# Patient Record
Sex: Female | Born: 1990
Health system: Southern US, Community
[De-identification: ages and names within clinical notes are randomized; demographics above are authoritative.]

## PROBLEM LIST (undated history)

## (undated) DIAGNOSIS — T7840XA Allergy, unspecified, initial encounter: Secondary | ICD-10-CM

## (undated) DIAGNOSIS — F32A Depression, unspecified: Secondary | ICD-10-CM

## (undated) DIAGNOSIS — F419 Anxiety disorder, unspecified: Secondary | ICD-10-CM

## (undated) DIAGNOSIS — D649 Anemia, unspecified: Secondary | ICD-10-CM

## (undated) DIAGNOSIS — F329 Major depressive disorder, single episode, unspecified: Secondary | ICD-10-CM

## (undated) DIAGNOSIS — R011 Cardiac murmur, unspecified: Secondary | ICD-10-CM

## (undated) DIAGNOSIS — B009 Herpesviral infection, unspecified: Secondary | ICD-10-CM

## (undated) HISTORY — DX: Anemia, unspecified: D64.9

## (undated) HISTORY — DX: Anxiety disorder, unspecified: F41.9

## (undated) HISTORY — PX: NO PAST SURGERIES: SHX2092

## (undated) HISTORY — DX: Herpesviral infection, unspecified: B00.9

## (undated) HISTORY — DX: Allergy, unspecified, initial encounter: T78.40XA

## (undated) HISTORY — DX: Cardiac murmur, unspecified: R01.1

## (undated) HISTORY — DX: Depression, unspecified: F32.A

## (undated) HISTORY — DX: Major depressive disorder, single episode, unspecified: F32.9

---

## 2012-05-30 ENCOUNTER — Ambulatory Visit: Payer: Self-pay | Admitting: Family Medicine

## 2012-05-30 VITALS — BP 100/60 | HR 60 | Temp 98.2°F | Resp 16 | Ht 66.0 in | Wt 134.0 lb

## 2012-05-30 DIAGNOSIS — B009 Herpesviral infection, unspecified: Secondary | ICD-10-CM

## 2012-05-30 DIAGNOSIS — E329 Disease of thymus, unspecified: Secondary | ICD-10-CM

## 2012-05-30 MED ORDER — ACYCLOVIR 200 MG PO CAPS: 400.0000 mg | ORAL_CAPSULE | Freq: Two times a day (BID) | ORAL | Status: DC

## 2012-05-30 NOTE — Progress Notes (Signed)
  Subjective:    Patient ID: Lauren Jensen, female    DOB: [birth year], 21 y.o.   MRN: [id number]  HPI  Started on daily suppression acyclovir after her first outbreak 1 yr prev as the outbreak was intensely painfuol.  Thinks she has had 2-3 very very mild outbreaks in the past year but unsure as not painful - just bumps.  Denies ever having ulcerations. States that she had nodules with her first outbreak and now frequently has these nodules - confident they are from the HSV and not glands or follicles as never had them prior. Do not bother her, not painful. Pt is sexually active in a monogamous relationship. Reports her partner also has HSV. Does not desire pregnancy, is only using condoms for birth control as doesn't want to take more pills but worried about Depo as sister had an allergic reaction to it.      Review of Systems  Constitutional: Negative for fever and chills.  Gastrointestinal: Negative for abdominal pain, diarrhea and constipation.  Genitourinary: Negative for dysuria, vaginal discharge, genital sores, vaginal pain, menstrual problem and pelvic pain.  Skin: Negative for rash and wound.       Objective:   Physical Exam  Constitutional: She is oriented to person, place, and time. She appears well-developed and well-nourished. No distress.  HENT:  Head: Normocephalic and atraumatic.  Right Ear: External ear normal.  Left Ear: External ear normal.  Eyes: Conjunctivae normal are normal. No scleral icterus.  Neck: Normal range of motion. Neck supple. No thyromegaly present.  Cardiovascular: Normal rate, regular rhythm and normal heart sounds.   Pulmonary/Chest: Effort normal and breath sounds normal. No respiratory distress.  Lymphadenopathy:    She has no cervical adenopathy.  Neurological: She is alert and oriented to person, place, and time.  Skin: Skin is warm and dry. She is not diaphoretic.  Psychiatric: She has a normal mood and affect. Her behavior is normal.           Assessment & Plan:  1. HSV - Discussed suppression vs recurrence acyclovir use and dosing.  Pt prefers to continue daily suppression dosing for now which is fine.  Reviewed pathology and transmission w/ pt. 2. Family Planning - Pt w/o health insurance - reviewed less expensive options for birth control. Pt thinks she would like to try Depo-Provera and will call the [facility] for an appt to get started.

## 2012-05-30 NOTE — Patient Instructions (Signed)
If you are having <6 outbreaks a year, you can stop taking the acyclovir daily and just use it for treatment. If you decide to do this, start taking 400mg  (2 tabs) of acyclovir three times a day for 5 days at the first sign that the outbreak is starting. The sooner you start it the more effective it will be.

## 2014-10-15 ENCOUNTER — Ambulatory Visit (INDEPENDENT_AMBULATORY_CARE_PROVIDER_SITE_OTHER): Payer: Self-pay | Admitting: Family Medicine

## 2014-10-15 VITALS — BP 116/62 | HR 74 | Temp 98.1°F | Resp 18 | Ht 66.25 in | Wt 164.4 lb

## 2014-10-15 DIAGNOSIS — J069 Acute upper respiratory infection, unspecified: Secondary | ICD-10-CM

## 2014-10-15 DIAGNOSIS — A609 Anogenital herpesviral infection, unspecified: Secondary | ICD-10-CM

## 2014-10-15 DIAGNOSIS — Z30011 Encounter for initial prescription of contraceptive pills: Secondary | ICD-10-CM

## 2014-10-15 MED ORDER — ACYCLOVIR 200 MG PO CAPS: ORAL_CAPSULE | ORAL | Status: DC

## 2014-10-15 MED ORDER — NORGESTIMATE-ETH ESTRADIOL 0.25-35 MG-MCG PO TABS: 1.0000 | ORAL_TABLET | Freq: Every day | ORAL | Status: DC

## 2014-10-15 NOTE — Progress Notes (Signed)
Subjective: Patient is here for 2 things. She wants birth control pills. She has not been on them before. She has had a regular sexual partner for the last 3/2 years, using condoms. She's had one pregnancy and abortion. She has not had any STDs except for herpes which she has had recurrences of over the years..  Her second problem is that she the herpes as noted above and wants medication. She uses acyclovir for that. She does not have insurance, and desires the least expensive possible.  Has had a respiratory tract infection which is been slowly resolving.  Objective: No acute distress. Chest clear. Heart regular without murmurs.  Assessment: Oral contraception HSV, history of recurrences Upper respiratory infections, slow resolving  Plan: Sprintec. Talked to her about 3 months cycling. Acyclovir 800 mg twice a day for 5 days when she has flares. The cough should continue to gradually resolve.

## 2014-10-15 NOTE — Patient Instructions (Signed)
Take birth control pills daily at same time.  After the 21 medicated pills move on to taking the next pack.  Do this for 2 packs, then on the third pack take through the placebo pills before starting on the next pills  Take valtrex 800 mg twice daily for herpes for 5 days each outbreak  Return if problems  Use condoms also to prevent disease transmission

## 2016-03-16 ENCOUNTER — Ambulatory Visit (INDEPENDENT_AMBULATORY_CARE_PROVIDER_SITE_OTHER): Payer: PRIVATE HEALTH INSURANCE | Admitting: Urgent Care

## 2016-03-16 VITALS — BP 122/76 | HR 85 | Temp 98.4°F | Resp 18 | Ht 66.25 in | Wt 166.0 lb

## 2016-03-16 DIAGNOSIS — L299 Pruritus, unspecified: Secondary | ICD-10-CM | POA: Diagnosis not present

## 2016-03-16 DIAGNOSIS — B009 Herpesviral infection, unspecified: Secondary | ICD-10-CM | POA: Diagnosis not present

## 2016-03-16 DIAGNOSIS — R21 Rash and other nonspecific skin eruption: Secondary | ICD-10-CM

## 2016-03-16 LAB — POCT SKIN KOH: SKIN KOH, POC: POSITIVE

## 2016-03-16 MED ORDER — TRIAMCINOLONE ACETONIDE 0.1 % EX CREA: 1.0000 "application " | TOPICAL_CREAM | Freq: Two times a day (BID) | CUTANEOUS | Status: DC

## 2016-03-16 MED ORDER — RANITIDINE HCL 150 MG PO TABS: 150.0000 mg | ORAL_TABLET | Freq: Two times a day (BID) | ORAL | Status: DC

## 2016-03-16 MED ORDER — FLUCONAZOLE 150 MG PO TABS: 150.0000 mg | ORAL_TABLET | ORAL | Status: DC

## 2016-03-16 MED ORDER — CETIRIZINE HCL 10 MG PO TABS: 10.0000 mg | ORAL_TABLET | Freq: Every day | ORAL | Status: DC

## 2016-03-16 MED ORDER — ACYCLOVIR 200 MG PO CAPS: ORAL_CAPSULE | ORAL | Status: DC

## 2016-03-16 NOTE — Patient Instructions (Addendum)
Rash A rash is a change in the color or texture of the skin. There are many different types of rashes. You may have other problems that accompany your rash. CAUSES   Infections.  Allergic reactions. This can include allergies to pets or foods.  Certain medicines.  Exposure to certain chemicals, soaps, or cosmetics.  Heat.  Exposure to poisonous plants.  Tumors, both cancerous and noncancerous. SYMPTOMS   Redness.  Scaly skin.  Itchy skin.  Dry or cracked skin.  Bumps.  Blisters.  Pain. DIAGNOSIS  Your caregiver may do a physical exam to determine what type of rash you have. A skin sample (biopsy) may be taken and examined under a microscope. TREATMENT  Treatment depends on the type of rash you have. Your caregiver may prescribe certain medicines. For serious conditions, you may need to see a skin doctor (dermatologist). HOME CARE INSTRUCTIONS   Avoid the substance that caused your rash.  Do not scratch your rash. This can cause infection.  You may take cool baths to help stop itching.  Only take over-the-counter or prescription medicines as directed by your caregiver.  Keep all follow-up appointments as directed by your caregiver. SEEK IMMEDIATE MEDICAL CARE IF:  You have increasing pain, swelling, or redness.  You have a fever.  You have new or severe symptoms.  You have body aches, diarrhea, or vomiting.  Your rash is not better after 3 days. MAKE SURE YOU:  Understand these instructions.  Will watch your condition.  Will get help right away if you are not doing well or get worse.   This information is not intended to replace advice given to you by your health care provider. Make sure you discuss any questions you have with your health care provider.   Document Released: 08/14/2002 Document Revised: 09/14/2014 Document Reviewed: 01/09/2015 Elsevier Interactive Patient Education 2016 Elsevier Inc.     IF you received an x-ray today, you will  receive an invoice from [facility]. Please contact [facility] at [phone] with questions or concerns regarding your invoice.   IF you received labwork today, you will receive an invoice from [facility]. Please contact [facility] at [phone] with questions or concerns regarding your invoice.   Our billing staff will not be able to assist you with questions regarding bills from these companies.  You will be contacted with the lab results as soon as they are available. The fastest way to get your results is to activate your My Chart account. Instructions are located on the last page of this paperwork. If you have not heard from us regarding the results in 2 weeks, please contact this office.      

## 2016-03-16 NOTE — Progress Notes (Signed)
    MRN: [id number] DOB: 01/17/1991  Subjective:   Lauren Romberglissa Jensen is a 25 y.o. female presenting for chief complaint of Medication Refill  Reports 1 week history of worsening severely pruritic rash. She states that it has spread to her right forearm and bicep, left arm and chest. This came on suddenly over 1 week. Has used cortisone cream with temporary relief only. Denies fever, pain, cough, shob, n/v, abdominal pain. She was diagnosed with genital HSV over 5 years ago and is worried that this is what the rash is. Her last outbreak over genitals was ~1.5 weeks ago. Needs a refill of acyclovir. Of note, she has been doing yard-work but does not know if she has come into contact with any poisonous plants.  Lauren Jensen has a current medication list which includes the following prescription(s): acyclovir, ibuprofen, and norgestimate-ethinyl estradiol. Also is allergic to penicillins.  Lauren Jensen  has a past medical history of Allergy; Anemia; Depression; and Heart murmur. Also  has no past surgical history on file.  Objective:   Vitals: BP 122/76 mmHg  Pulse 85  Temp(Src) 98.4 F (36.9 C) (Oral)  Resp 18  Ht 5' 6.25" (1.683 m)  Wt 166 lb (75.297 kg)  BMI 26.58 kg/m2  SpO2 98%  LMP 03/02/2016  Physical Exam  Constitutional: She is oriented to person, place, and time. She appears well-developed and well-nourished.  Cardiovascular: Normal rate, regular rhythm and intact distal pulses.  Exam reveals no gallop and no friction rub.   No murmur heard. Pulmonary/Chest: No respiratory distress. She has no wheezes. She has no rales.  Neurological: She is alert and oriented to person, place, and time.  Skin: Skin is warm and dry. Rash (erythematous excorations in linear distribution over arms bilaterally, upper chest and left neck; also has annular lesions with central clearing) noted.   Results for orders placed or performed in visit on 03/16/16 (from the past 24 hour(s))  POCT Skin KOH     Status: None   Collection Time: 03/16/16  3:49 PM  Result Value Ref Range   Skin KOH, POC Positive    Assessment and Plan :   1. Rash and nonspecific skin eruption 2. Itching - Start diflucan for suspected fungal infection. Patient is to use Zyrtec and Zantac for itching. Patient is to try topical steroid if there is no improvement with diflucan.  3. Herpes simplex virus infection - Refilled acyclovir  Wallis BambergMario Mikaila Grunert, PA-C [facility] [phone] 03/16/2016 3:15 PM

## 2016-04-07 ENCOUNTER — Ambulatory Visit (INDEPENDENT_AMBULATORY_CARE_PROVIDER_SITE_OTHER): Payer: PRIVATE HEALTH INSURANCE | Admitting: Family Medicine

## 2016-04-07 VITALS — BP 110/80 | HR 68 | Temp 98.2°F | Resp 17 | Ht 66.0 in | Wt 168.0 lb

## 2016-04-07 DIAGNOSIS — Z23 Encounter for immunization: Secondary | ICD-10-CM | POA: Diagnosis not present

## 2016-04-07 DIAGNOSIS — B351 Tinea unguium: Secondary | ICD-10-CM | POA: Diagnosis not present

## 2016-04-07 DIAGNOSIS — Z3009 Encounter for other general counseling and advice on contraception: Secondary | ICD-10-CM

## 2016-04-07 DIAGNOSIS — Z309 Encounter for contraceptive management, unspecified: Secondary | ICD-10-CM

## 2016-04-07 DIAGNOSIS — Z0000 Encounter for general adult medical examination without abnormal findings: Secondary | ICD-10-CM | POA: Diagnosis not present

## 2016-04-07 DIAGNOSIS — Z136 Encounter for screening for cardiovascular disorders: Secondary | ICD-10-CM

## 2016-04-07 DIAGNOSIS — R5383 Other fatigue: Secondary | ICD-10-CM

## 2016-04-07 DIAGNOSIS — Z01419 Encounter for gynecological examination (general) (routine) without abnormal findings: Secondary | ICD-10-CM | POA: Diagnosis not present

## 2016-04-07 DIAGNOSIS — Z00129 Encounter for routine child health examination without abnormal findings: Secondary | ICD-10-CM

## 2016-04-07 LAB — POCT URINALYSIS DIP (MANUAL ENTRY)
BILIRUBIN UA: NEGATIVE
Bilirubin, UA: NEGATIVE
GLUCOSE UA: NEGATIVE
Leukocytes, UA: NEGATIVE
Nitrite, UA: NEGATIVE
Protein Ur, POC: NEGATIVE
RBC UA: NEGATIVE
Urobilinogen, UA: 0.2
pH, UA: 7

## 2016-04-07 LAB — POC MICROSCOPIC URINALYSIS (UMFC): MUCUS RE: ABSENT

## 2016-04-07 LAB — POCT URINE PREGNANCY: Preg Test, Ur: NEGATIVE

## 2016-04-07 MED ORDER — NORGESTIMATE-ETH ESTRADIOL 0.25-35 MG-MCG PO TABS: 1.0000 | ORAL_TABLET | Freq: Every day | ORAL | 4 refills | Status: DC

## 2016-04-07 NOTE — Patient Instructions (Addendum)
Follow-up in 3 months after starting     IF you received an x-ray today, you will receive an invoice from [facility]. Please contact [facility] at [phone] with questions or concerns regarding your invoice.   IF you received labwork today, you will receive an invoice from [employer]. Please contact [facility] at [phone] with questions or concerns regarding your invoice.   Our billing staff will not be able to assist you with questions regarding bills from these companies.  You will be contacted with the lab results as soon as they are available. The fastest way to get your results is to activate your My Chart account. Instructions are located on the last page of this paperwork. If you have not heard from [place] regarding the results in 2 weeks, please contact this office.      Exercising to Stay Healthy Exercising regularly is important. It has many health benefits, such as:  Improving your overall fitness, flexibility, and endurance.  Increasing your bone density.  Helping with weight control.  Decreasing your body fat.  Increasing your muscle strength.  Reducing stress and tension.  Improving your overall health. In order to become healthy and stay healthy, it is recommended that you do moderate-intensity and vigorous-intensity exercise. You can tell that you are exercising at a moderate intensity if you have a higher heart rate and faster breathing, but you are still able to hold a conversation. You can tell that you are exercising at a vigorous intensity if you are breathing much harder and faster and cannot hold a conversation while exercising. HOW OFTEN SHOULD I EXERCISE? Choose an activity that you enjoy and set realistic goals. Your health care provider can help you to make an activity plan that works for you. Exercise regularly as directed by your health care provider. This may include:   Doing resistance training twice  each week, such as:  Push-ups.  Sit-ups.  Lifting weights.  Using resistance bands.  Doing a given intensity of exercise for a given amount of time. Choose from these options:  150 minutes of moderate-intensity exercise every week.  75 minutes of vigorous-intensity exercise every week.  A mix of moderate-intensity and vigorous-intensity exercise every week. Children, pregnant women, people who are out of shape, people who are overweight, and older adults may need to consult a health care provider for individual recommendations. If you have any sort of medical condition, be sure to consult your health care provider before starting a new exercise program.  WHAT ARE SOME EXERCISE IDEAS? Some moderate-intensity exercise ideas include:   Walking at a rate of 1 mile in 15 minutes.  Biking.  Hiking.  Golfing.  Dancing. Some vigorous-intensity exercise ideas include:   Walking at a rate of at least 4.5 miles per hour.  Jogging or running at a rate of 5 miles per hour.  Biking at a rate of at least 10 miles per hour.  Lap swimming.  Roller-skating or in-line skating.  Cross-country skiing.  Vigorous competitive sports, such as football, basketball, and soccer.  Jumping rope.  Aerobic dancing. WHAT ARE SOME EVERYDAY ACTIVITIES THAT CAN HELP ME TO GET EXERCISE?  Yard work, such as:  Child psychotherapist.  Raking and bagging leaves.  Washing and waxing your car.  Pushing a stroller.  Shoveling snow.  Gardening.  Washing windows or floors. HOW CAN I BE MORE ACTIVE IN MY DAY-TO-DAY ACTIVITIES?  Use the stairs instead of the elevator.  Take a walk during  your lunch break.  If you drive, park your car farther away from work or school.  If you take public transportation, get off one stop early and walk the rest of the way.  Make all of your phone calls while standing up and walking around.  Get up, stretch, and walk around every 30 minutes throughout the  day. WHAT GUIDELINES SHOULD I FOLLOW WHILE EXERCISING?  Do not exercise so much that you hurt yourself, feel dizzy, or get very short of breath.  Consult your health care provider before starting a new exercise program.  Wear comfortable clothes and shoes with good support.  Drink plenty of water while you exercise to prevent dehydration or heat stroke. Body water is lost during exercise and must be replaced.  Work out until you breathe faster and your heart beats faster.   This information is not intended to replace advice given to you by your health care provider. Make sure you discuss any questions you have with your health care provider.   Document Released: 09/26/2010 Document Revised: 09/14/2014 Document Reviewed: 01/25/2014 Elsevier Interactive Patient Education 2016 Elsevier Inc.   Preventing Toenail Fungus from Recurring   Sanitize your shoes with Mycomist spray or a similar shoe sanitizer spray.  Follow the instructions on the bottle and dry them outside in the sun or with a hairdryer.  We also recommend repeating the sanitization once weekly in shoes you wear most often.   Throw away any shoes you have worn a significant amount without socks-fungus thrives in a warm moist environment and you want to avoid re-infection after your laser procedure   Bleach your socks with regular or color safe bleach   Change your socks regularly to keep your feet clean and dry (especially if you have sweaty feet)-if sweaty feet are a problem, let your doctor know-there is a great lotion that helps with this problem.   Clean your toenail clippers with alcohol before you use them if you do your own toenails and make sure to replace Emory boards and orange sticks regularly   If you get regular pedicures, bring your own instruments or go to a spa that sterilizes their instruments in an autoclave.

## 2016-04-07 NOTE — Progress Notes (Addendum)
Patient ID: Lauren Jensen, female    DOB: Mar 23, 1991, 25 y.o.   MRN: [id number]  PCP: Norberto Sorenson, MD  Chief Complaint  Patient presents with  . Annual Exam    School   . Medication Refill    sprintec    Subjective:   HPI Presents for annual physical and gyn examination.  She  Patient will be starting dental hygienist program at [employer] in the fall.  She needs a form completed and varicella titer. Overall patient reports good health.  She is in a monogamous relationship with her boyfriend of 5 years.  She works as an Lawyer. Denies anxiety or depression. Would like to obtain a GYN exam today as she has never had one.  She also request a refill on birth controll pills and would like the fungus on her toes addressed.  She has battled with toenail fungus for greater than 6 month.  She was recently seen in the office for rash and toe nail fungus.  Reports she was given Diflucan for rash and was told her toenail fungus would also improve.  She remarks rash cleared but toenails, especially of the great toe remain covered in fungus.   . Social History   Social History  . Marital status: Single    Spouse name: N/A  . Number of children: N/A  . Years of education: N/A   Occupational History  . Not on file.   Social History Main Topics  . Smoking status: Never Smoker  . Smokeless tobacco: Not on file  . Alcohol use Not on file  . Drug use: Unknown  . Sexual activity: Not on file   Other Topics Concern  . Not on file   Social History Narrative  . No narrative on file    . Family History  Problem Relation Age of Onset  . Mental illness Father   . Mental illness Sister   . Mental illness Brother      Review of Systems  Constitutional: Positive for fatigue.  HENT: Negative.   Eyes: Negative.   Respiratory: Negative.   Cardiovascular: Negative.   Gastrointestinal: Negative.   Endocrine: Negative.   Genitourinary: Negative for genital sores, pelvic pain, vaginal bleeding and  vaginal pain.  Allergic/Immunologic: Negative.   Neurological: Negative.   Hematological: Negative.   Psychiatric/Behavioral: Negative.        There are no active problems to display for this patient.    Prior to Admission medications   Medication Sig Start Date End Date Taking? Authorizing Provider  acyclovir (ZOVIRAX) 200 MG capsule Take 4 pills twice daily (800 mg) for 5 days for herpes outbreaks. 03/16/16  Yes Wallis Bamberg, PA-C  cetirizine (ZYRTEC) 10 MG tablet Take 1 tablet (10 mg total) by mouth daily. 03/16/16  Yes Wallis Bamberg, PA-C  fluconazole (DIFLUCAN) 150 MG tablet Take 1 tablet (150 mg total) by mouth once a week. Repeat if needed 03/16/16  Yes Wallis Bamberg, PA-C  IBUPROFEN PO Take by mouth as needed.   Yes Historical Provider, MD  norgestimate-ethinyl estradiol (ORTHO-CYCLEN,SPRINTEC,PREVIFEM) 0.25-35 MG-MCG tablet Take 1 tablet by mouth daily. 10/15/14  Yes Peyton Najjar, MD  ranitidine (ZANTAC) 150 MG tablet Take 1 tablet (150 mg total) by mouth 2 (two) times daily. 03/16/16  Yes Wallis Bamberg, PA-C  triamcinolone cream (KENALOG) 0.1 % Apply 1 application topically 2 (two) times daily. 03/16/16  Yes Wallis Bamberg, PA-C     Allergies  Allergen Reactions  . Penicillins Hives  Objective:  Physical Exam  Constitutional: She is oriented to person, place, and time. She appears well-developed and well-nourished.  HENT:  Head: Normocephalic and atraumatic.  Right Ear: External ear normal.  Left Ear: External ear normal.  Nose: Nose normal.  Eyes: Conjunctivae and EOM are normal. Pupils are equal, round, and reactive to light.  Neck: Normal range of motion.  Cardiovascular: Normal rate, regular rhythm and normal heart sounds.   Pulmonary/Chest: Effort normal and breath sounds normal.  Abdominal: Soft. Bowel sounds are normal.  Genitourinary: Vagina normal. No vaginal discharge found.  Musculoskeletal: Normal range of motion.  Neurological: She is alert and oriented to  person, place, and time. She has normal reflexes.  Skin: Skin is warm and dry.  Psychiatric: She has a normal mood and affect. Her behavior is normal. Judgment and thought content normal.    Assessment & Plan:  .1. Annual physical exam Age appropriate anticipatory guidance provided. 2. Encounter for routine gynecological examination - Pap IG, CT/NG w/ reflex HPV when ASC-U  3. Encounter for screening for cardiovascular disorders - Lipid panel  4. Encounter for general counseling on prescription of oral contraceptives - POCT urine pregnancy POCT urinalysis dipstick - POCT Microscopic Urinalysis ([facility])  5. Toenail fungus - COMPLETE METABOLIC PANEL WITH GFR - CBC with Differential/Platelet Start Terbinafine 250 mg x 12 weeks  Other fatigue - TSH-normal  7. Encounter for childhood immunizations appropriate for age - Varicella zoster antibody, IgG  Follow-up as needed.  Godfrey Pick. Tiburcio Pea, MSN, FNP-C [facility]

## 2016-04-08 ENCOUNTER — Telehealth: Payer: Self-pay | Admitting: Family Medicine

## 2016-04-08 LAB — CBC WITH DIFFERENTIAL/PLATELET
BASOS ABS: 0 {cells}/uL (ref 0–200)
Basophils Relative: 0 %
EOS ABS: 0 {cells}/uL — AB (ref 15–500)
Eosinophils Relative: 0 %
HEMATOCRIT: 39.7 % (ref 35.0–45.0)
HEMOGLOBIN: 13.5 g/dL (ref 11.7–15.5)
LYMPHS ABS: 2160 {cells}/uL (ref 850–3900)
Lymphocytes Relative: 27 %
MCH: 30.4 pg (ref 27.0–33.0)
MCHC: 34 g/dL (ref 32.0–36.0)
MCV: 89.4 fL (ref 80.0–100.0)
MONO ABS: 560 {cells}/uL (ref 200–950)
MPV: 9.3 fL (ref 7.5–12.5)
Monocytes Relative: 7 %
NEUTROS PCT: 66 %
Neutro Abs: 5280 cells/uL (ref 1500–7800)
Platelets: 294 10*3/uL (ref 140–400)
RBC: 4.44 MIL/uL (ref 3.80–5.10)
RDW: 13.4 % (ref 11.0–15.0)
WBC: 8 10*3/uL (ref 3.8–10.8)

## 2016-04-08 LAB — COMPLETE METABOLIC PANEL WITH GFR
ALBUMIN: 4.3 g/dL (ref 3.6–5.1)
ALK PHOS: 68 U/L (ref 33–115)
ALT: 13 U/L (ref 6–29)
AST: 15 U/L (ref 10–30)
BUN: 8 mg/dL (ref 7–25)
CHLORIDE: 104 mmol/L (ref 98–110)
CO2: 23 mmol/L (ref 20–31)
Calcium: 9.4 mg/dL (ref 8.6–10.2)
Creat: 0.73 mg/dL (ref 0.50–1.10)
GFR, Est African American: >89 mL/min (ref >=60)
GFR, Est Non African American: >89 mL/min (ref >=60)
GLUCOSE: 86 mg/dL (ref 65–99)
POTASSIUM: 4.3 mmol/L (ref 3.5–5.3)
SODIUM: 140 mmol/L (ref 135–146)
Total Bilirubin: 0.3 mg/dL (ref 0.2–1.2)
Total Protein: 7.2 g/dL (ref 6.1–8.1)

## 2016-04-08 LAB — LIPID PANEL
CHOL/HDL RATIO: 1.9 ratio (ref <=5.0)
Cholesterol: 159 mg/dL (ref 125–200)
HDL: 84 mg/dL (ref >=46)
LDL Cholesterol: 68 mg/dL (ref <130)
Triglycerides: 37 mg/dL (ref <150)
VLDL: 7 mg/dL (ref <30)

## 2016-04-08 LAB — TSH: TSH: 1.74 mIU/L

## 2016-04-08 MED ORDER — TERBINAFINE HCL 250 MG PO TABS: 250.0000 mg | ORAL_TABLET | Freq: Every day | ORAL | 0 refills | Status: AC

## 2016-04-08 NOTE — Telephone Encounter (Signed)
Spoke with patient and advised form for school is ready for pickup.  Varicella titer is still pending, patient requests letter be mailed to her with titer results.  Lauren Jensen. Tiburcio Pea, MSN, FNP-C [facility]

## 2016-04-09 ENCOUNTER — Encounter: Payer: Self-pay | Admitting: Family Medicine

## 2016-04-09 ENCOUNTER — Encounter: Payer: Self-pay | Admitting: *Deleted

## 2016-04-09 LAB — PAP IG, CT-NG, RFX HPV ASCU
Chlamydia Probe Amp: NOT DETECTED
GC Probe Amp: NOT DETECTED

## 2016-04-09 LAB — VARICELLA ZOSTER ANTIBODY, IGG: Varicella IgG: 738.3 Index — ABNORMAL HIGH (ref <135.00)

## 2016-04-09 NOTE — Progress Notes (Signed)
Sent letter advising of positive varicella titer.

## 2017-02-16 ENCOUNTER — Encounter: Payer: Self-pay | Admitting: Urgent Care

## 2017-02-16 ENCOUNTER — Ambulatory Visit (INDEPENDENT_AMBULATORY_CARE_PROVIDER_SITE_OTHER): Payer: PRIVATE HEALTH INSURANCE | Admitting: Urgent Care

## 2017-02-16 VITALS — BP 104/67 | HR 80 | Temp 97.4°F | Resp 16 | Ht 66.0 in | Wt 174.6 lb

## 2017-02-16 DIAGNOSIS — R21 Rash and other nonspecific skin eruption: Secondary | ICD-10-CM

## 2017-02-16 DIAGNOSIS — B354 Tinea corporis: Secondary | ICD-10-CM

## 2017-02-16 DIAGNOSIS — L52 Erythema nodosum: Secondary | ICD-10-CM | POA: Diagnosis not present

## 2017-02-16 LAB — POCT SKIN KOH: Skin KOH, POC: POSITIVE — AB

## 2017-02-16 LAB — POCT CBC
GRANULOCYTE PERCENT: 63.5 % (ref 37–80)
HEMATOCRIT: 37.3 % — AB (ref 37.7–47.9)
HEMOGLOBIN: 13.1 g/dL (ref 12.2–16.2)
LYMPH, POC: 2.4 (ref 0.6–3.4)
MCH: 30.9 pg (ref 27–31.2)
MCHC: 35.2 g/dL (ref 31.8–35.4)
MCV: 87.9 fL (ref 80–97)
MID (cbc): 0.4 (ref 0–0.9)
MPV: 7.5 fL (ref 0–99.8)
POC GRANULOCYTE: 4.8 (ref 2–6.9)
POC LYMPH PERCENT: 31.4 %L (ref 10–50)
POC MID %: 5.1 % (ref 0–12)
Platelet Count, POC: 286 10*3/uL (ref 142–424)
RBC: 4.25 M/uL (ref 4.04–5.48)
RDW, POC: 12.8 %
WBC: 7.6 10*3/uL (ref 4.6–10.2)

## 2017-02-16 MED ORDER — NAPROXEN SODIUM 550 MG PO TABS: 550.0000 mg | ORAL_TABLET | Freq: Two times a day (BID) | ORAL | 1 refills | Status: DC

## 2017-02-16 MED ORDER — KETOCONAZOLE 2 % EX CREA: 1.0000 "application " | TOPICAL_CREAM | Freq: Every day | CUTANEOUS | 0 refills | Status: DC

## 2017-02-16 NOTE — Progress Notes (Signed)
  MRN: [id number] DOB: 09/27/1990  Subjective:   Lauren Jensen is a 26 y.o. female presenting for chief complaint of Rash (on both legs by calf, left foot and between breast;)  Rash legs - Reports 4 day history of severely pruritic nodules/rash over her lower legs bilaterally. She has also had a headache. Denies fever, cough, sore throat, red eyes, chest pain, shob, n/v, abdominal pain, swelling. Denies smoking cigarettes.  Rash torso - Reports 1 month history of circular spot over her upper abdomen, inner left thigh. Rash does not bother patient.   Lauren Jensen has a current medication list which includes the following prescription(s): acyclovir, cetirizine, ibuprofen, and norgestimate-ethinyl estradiol. Also is allergic to penicillins. Lauren Jensen  has a past medical history of Allergy; Anemia; Depression; and Heart murmur. Also denies past surgical history.  Objective:   Vitals: BP 104/67   Pulse 80   Temp 97.4 F (36.3 C) (Oral)   Resp 16   Ht 5\' 6"  (1.676 m)   Wt 174 lb 9.6 oz (79.2 kg)   SpO2 96%   BMI 28.18 kg/m   Physical Exam  Constitutional: She is oriented to person, place, and time. She appears well-developed and well-nourished.  HENT:  Mouth/Throat: Oropharynx is clear and moist.  Eyes: Right eye exhibits no discharge. Left eye exhibits no discharge. No scleral icterus.  Neck: Normal range of motion. Neck supple.  Cardiovascular: Normal rate, regular rhythm and intact distal pulses.  Exam reveals no gallop and no friction rub.   No murmur heard. Pulmonary/Chest: No respiratory distress. She has no wheezes. She has no rales.  Lymphadenopathy:    She has no cervical adenopathy.  Neurological: She is alert and oriented to person, place, and time.  Skin: Skin is warm and dry. Rash (erythema nodosum noted over her lower legs bilaterally) noted.     Psychiatric: She has a normal mood and affect.   Results for orders placed or performed in visit on 02/16/17 (from the past 24  hour(s))  POCT Skin KOH     Status: Abnormal   Collection Time: 02/16/17  2:17 PM  Result Value Ref Range   Skin KOH, POC Positive (A) Negative  POCT CBC     Status: Abnormal   Collection Time: 02/16/17  2:30 PM  Result Value Ref Range   WBC 7.6 4.6 - 10.2 K/uL   Lymph, poc 2.4 0.6 - 3.4   POC LYMPH PERCENT 31.4 10 - 50 %L   MID (cbc) 0.4 0 - 0.9   POC MID % 5.1 0 - 12 %M   POC Granulocyte 4.8 2 - 6.9   Granulocyte percent 63.5 37 - 80 %G   RBC 4.25 4.04 - 5.48 M/uL   Hemoglobin 13.1 12.2 - 16.2 g/dL   HCT, POC 41.337.3 (A) 24.437.7 - 47.9 %   MCV 87.9 80 - 97 fL   MCH, POC 30.9 27 - 31.2 pg   MCHC 35.2 31.8 - 35.4 g/dL   RDW, POC 01.012.8 %   Platelet Count, POC 286 142 - 424 K/uL   MPV 7.5 0 - 99.8 fL    Assessment and Plan :   1. Erythema nodosum 2. Rash and nonspecific skin eruption - Counseled patient on diagnosis. Start Anaprox with food and recheck in 2 weeks. Labs pending.  3. Tinea corporis - Start ketoconazole.   Wallis BambergMario Christianna Belmonte, PA-C [facility] [phone] 02/16/2017  2:05 PM

## 2017-02-16 NOTE — Patient Instructions (Addendum)
Erythema Nodosum Erythema nodosum is a skin condition in which patches of fat under the skin of the lower legs become inflamed. This causes painful bumps (nodules) to form. What are the causes? Common causes of this condition include:  Infections.  Certain medicines, especially birth control pills, penicillin, and sulfa medicines.  Other causes include:  Pregnancy.  Certain inflammatory conditions, including Lupus, Crohn's disease, and thyroid conditions.  In some cases, the cause may not be known. What increases the risk? This condition is more likely to develop in young adult women. What are the signs or symptoms? The main symptom of this condition is large nodules that look like raised bruises and are tender to the touch. These nodules usually appear on the shins, but they may also appear on the arms or the trunk. They gradually change in color from pink to brown, and they leave a dark mark that clears up in several months. Other symptoms include:  Fever.  Fatigue.  Joint pain.  Itchiness.  How is this diagnosed? This condition is diagnosed based on symptoms. To find the underlying condition that caused the erythema nodosum, your health care provider may also do a physical exam, X-rays, and blood tests. How is this treated? Treatment for this condition depends on the cause. The nodules usually go away with treatment of the underlying condition. Any pain or discomfort may be treated with:  Anti-inflammatory medicines.  Bed rest.  Raising (elevating) the affected area.  Cool compresses.  In some cases, steroids and potassium iodide tablets may be given. Follow these instructions at home:  Take medicines only as directed by your health care provider.  Stay in bed for as long as directed by your health care provider.  Until your symptoms go away, limit any exercising that makes you breathe harder and faster (vigorous).  Elevate the affected leg as directed by your  health care provider.  Apply cool compresses to the affected area as directed by your health care provider. Contact a health care provider if:  Your symptoms are not improving.  You have a fever that does not go away. Get help right away if:  Your condition gets worse.  Your pain gets worse.  You have a sore throat.  You vomit repeatedly. This information is not intended to replace advice given to you by your health care provider. Make sure you discuss any questions you have with your health care provider. Document Released: 10/01/2004 Document Revised: 01/30/2016 Document Reviewed: 08/01/2014 Elsevier Interactive Patient Education  2018 [employer]     IF you received an x-ray today, you will receive an invoice from [facility]. Please contact [facility] at [phone] with questions or concerns regarding your invoice.   IF you received labwork today, you will receive an invoice from [facility]. Please contact [facility] at [phone] with questions or concerns regarding your invoice.   Our billing staff will not be able to assist you with questions regarding bills from these companies.  You will be contacted with the lab results as soon as they are available. The fastest way to get your results is to activate your My Chart account. Instructions are located on the last page of this paperwork. If you have not heard from us regarding the results in 2 weeks, please contact this office.

## 2017-02-17 LAB — ANA W/REFLEX IF POSITIVE
ANA: POSITIVE — AB
Anti JO-1: <0.2 AI (ref 0.0–0.9)
Centromere Ab Screen: <0.2 AI (ref 0.0–0.9)
Chromatin Ab SerPl-aCnc: <0.2 AI (ref 0.0–0.9)
ENA RNP AB: 1.1 AI — AB (ref 0.0–0.9)
dsDNA Ab: 1 IU/mL (ref 0–9)

## 2017-02-17 LAB — EPSTEIN-BARR VIRUS VCA ANTIBODY PANEL
EBV NA IgG: 134 U/mL — ABNORMAL HIGH (ref 0.0–17.9)
EBV VCA IGG: 333 U/mL — AB (ref 0.0–17.9)

## 2017-02-17 LAB — BASIC METABOLIC PANEL
BUN/Creatinine Ratio: 12 (ref 9–23)
BUN: 8 mg/dL (ref 6–20)
CALCIUM: 9.4 mg/dL (ref 8.7–10.2)
CHLORIDE: 100 mmol/L (ref 96–106)
CO2: 23 mmol/L (ref 20–29)
Creatinine, Ser: 0.67 mg/dL (ref 0.57–1.00)
GFR calc Af Amer: 141 mL/min/{1.73_m2} (ref >59)
GFR calc non Af Amer: 123 mL/min/{1.73_m2} (ref >59)
GLUCOSE: 96 mg/dL (ref 65–99)
POTASSIUM: 3.9 mmol/L (ref 3.5–5.2)
SODIUM: 139 mmol/L (ref 134–144)

## 2017-02-17 LAB — TSH: TSH: 1.69 u[IU]/mL (ref 0.450–4.500)

## 2017-02-17 LAB — HIV ANTIBODY (ROUTINE TESTING W REFLEX): HIV SCREEN 4TH GENERATION: NONREACTIVE

## 2017-02-17 LAB — SEDIMENTATION RATE: SED RATE: 6 mm/h (ref 0–32)

## 2017-02-22 ENCOUNTER — Other Ambulatory Visit: Payer: Self-pay | Admitting: Urgent Care

## 2017-02-22 ENCOUNTER — Telehealth: Payer: Self-pay | Admitting: Family Medicine

## 2017-02-22 DIAGNOSIS — L52 Erythema nodosum: Secondary | ICD-10-CM

## 2017-02-22 DIAGNOSIS — R768 Other specified abnormal immunological findings in serum: Secondary | ICD-10-CM

## 2017-02-22 NOTE — Telephone Encounter (Signed)
Pt calling about labs °

## 2017-02-22 NOTE — Telephone Encounter (Signed)
Left message to return call 

## 2017-02-22 NOTE — Telephone Encounter (Signed)
Pt called back and read [name]'s note in the lab results to her and she asked for a copy of her Results I advised pt I would print these and leave at front desk to pick up tomorrow. She will pick them up tomorrow

## 2017-02-23 NOTE — Telephone Encounter (Signed)
Pt came by and picked up a copy of her labs

## 2017-03-31 ENCOUNTER — Ambulatory Visit (INDEPENDENT_AMBULATORY_CARE_PROVIDER_SITE_OTHER): Payer: PRIVATE HEALTH INSURANCE

## 2017-03-31 ENCOUNTER — Ambulatory Visit (INDEPENDENT_AMBULATORY_CARE_PROVIDER_SITE_OTHER): Payer: PRIVATE HEALTH INSURANCE | Admitting: Urgent Care

## 2017-03-31 ENCOUNTER — Encounter: Payer: Self-pay | Admitting: Urgent Care

## 2017-03-31 DIAGNOSIS — M542 Cervicalgia: Secondary | ICD-10-CM

## 2017-03-31 DIAGNOSIS — M545 Low back pain, unspecified: Secondary | ICD-10-CM

## 2017-03-31 DIAGNOSIS — S0003XA Contusion of scalp, initial encounter: Secondary | ICD-10-CM | POA: Diagnosis not present

## 2017-03-31 DIAGNOSIS — M62830 Muscle spasm of back: Secondary | ICD-10-CM | POA: Diagnosis not present

## 2017-03-31 MED ORDER — CYCLOBENZAPRINE HCL 5 MG PO TABS: 5.0000 mg | ORAL_TABLET | Freq: Three times a day (TID) | ORAL | 1 refills | Status: DC | PRN

## 2017-03-31 MED ORDER — NAPROXEN SODIUM 550 MG PO TABS: 550.0000 mg | ORAL_TABLET | Freq: Two times a day (BID) | ORAL | 1 refills | Status: DC
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
## 2017-04-08 ENCOUNTER — Encounter: Payer: Self-pay | Admitting: Urgent Care

## 2017-04-08 ENCOUNTER — Ambulatory Visit (INDEPENDENT_AMBULATORY_CARE_PROVIDER_SITE_OTHER): Payer: PRIVATE HEALTH INSURANCE | Admitting: Urgent Care

## 2017-04-08 VITALS — BP 119/74 | HR 81 | Temp 97.9°F | Resp 16 | Ht 66.0 in | Wt 173.8 lb

## 2017-04-08 DIAGNOSIS — M542 Cervicalgia: Secondary | ICD-10-CM | POA: Diagnosis not present

## 2017-04-08 DIAGNOSIS — M62830 Muscle spasm of back: Secondary | ICD-10-CM | POA: Diagnosis not present

## 2017-04-08 DIAGNOSIS — F418 Other specified anxiety disorders: Secondary | ICD-10-CM

## 2017-04-08 MED ORDER — ALPRAZOLAM 0.25 MG PO TABS: 0.2500 mg | ORAL_TABLET | Freq: Two times a day (BID) | ORAL | 0 refills | Status: DC | PRN

## 2017-04-08 MED ORDER — PROPRANOLOL HCL 10 MG PO TABS: 10.0000 mg | ORAL_TABLET | Freq: Three times a day (TID) | ORAL | 1 refills | Status: DC
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
## 2017-04-08 NOTE — Patient Instructions (Addendum)
Naproxen (Alleve) Use 400-440mg  (2 tablets) of naproxen twice daily with food. You can continue to use Flexeril with this. If you experience drowsiness, then take Flexeril at night only.  Anxiety For situational anxiety that is not controlled with propranolol 3 times daily, you can use Xanax 0.25mg  as needed for very emotionally stressful reactions. Do not use more than 2 tablets of Xanax daily.   Back Pain, Adult Back pain is very common in adults.The cause of back pain is rarely dangerous and the pain often gets better over time.The cause of your back pain may not be known. Some common causes of back pain include:  Strain of the muscles or ligaments supporting the spine.  Wear and tear (degeneration) of the spinal disks.  Arthritis.  Direct injury to the back.  For many people, back pain may return. Since back pain is rarely dangerous, most people can learn to manage this condition on their own. Follow these instructions at home: Watch your back pain for any changes. The following actions may help to lessen any discomfort you are feeling:  Remain active. It is stressful on your back to sit or stand in one place for long periods of time. Do not sit, drive, or stand in one place for more than 30 minutes at a time. Take short walks on even surfaces as soon as you are able.Try to increase the length of time you walk each day.  Exercise regularly as directed by your health care provider. Exercise helps your back heal faster. It also helps avoid future injury by keeping your muscles strong and flexible.  Do not stay in bed.Resting more than 1-2 days can delay your recovery.  Pay attention to your body when you bend and lift. The most comfortable positions are those that put less stress on your recovering back. Always use proper lifting techniques, including: ? Bending your knees. ? Keeping the load close to your body. ? Avoiding twisting.  Find a comfortable position to sleep. Use a  firm mattress and lie on your side with your knees slightly bent. If you lie on your back, put a pillow under your knees.  Avoid feeling anxious or stressed.Stress increases muscle tension and can worsen back pain.It is important to recognize when you are anxious or stressed and learn ways to manage it, such as with exercise.  Take medicines only as directed by your health care provider. Over-the-counter medicines to reduce pain and inflammation are often the most helpful.Your health care provider may prescribe muscle relaxant drugs.These medicines help dull your pain so you can more quickly return to your normal activities and healthy exercise.  Apply ice to the injured area: ? Put ice in a plastic bag. ? Place a towel between your skin and the bag. ? Leave the ice on for 20 minutes, 2-3 times a day for the first 2-3 days. After that, ice and heat may be alternated to reduce pain and spasms.  Maintain a healthy weight. Excess weight puts extra stress on your back and makes it difficult to maintain good posture.  Contact a health care provider if:  You have pain that is not relieved with rest or medicine.  You have increasing pain going down into the legs or buttocks.  You have pain that does not improve in one week.  You have night pain.  You lose weight.  You have a fever or chills. Get help right away if:  You develop new bowel or bladder control problems.  You  have unusual weakness or numbness in your arms or legs.  You develop nausea or vomiting.  You develop abdominal pain.  You feel faint. This information is not intended to replace advice given to you by your health care provider. Make sure you discuss any questions you have with your health care provider. Document Released: 08/24/2005 Document Revised: 01/02/2016 Document Reviewed: 12/26/2013 Elsevier Interactive Patient Education  2017 Elsevier Inc.    Alprazolam tablets What is this medicine? ALPRAZOLAM  (al PRAY zoe lam) is a benzodiazepine. It is used to treat anxiety and panic attacks. This medicine may be used for other purposes; ask your health care provider or pharmacist if you have questions. COMMON BRAND NAME(S): Xanax What should I tell my health care provider before I take this medicine? They need to know if you have any of these conditions: -an alcohol or drug abuse problem -bipolar disorder, depression, psychosis or other mental health conditions -glaucoma -kidney or liver disease -lung or breathing disease -myasthenia gravis -Parkinson's disease -porphyria -seizures or a history of seizures -suicidal thoughts -an unusual or allergic reaction to alprazolam, other benzodiazepines, foods, dyes, or preservatives -pregnant or trying to get pregnant -breast-feeding How should I use this medicine? Take this medicine by mouth with a glass of water. Follow the directions on the prescription label. Take your medicine at regular intervals. Do not take it more often than directed. Do not stop taking except on your doctor's advice. A special MedGuide will be given to you by the pharmacist with each prescription and refill. Be sure to read this information carefully each time. Talk to your pediatrician regarding the use of this medicine in children. Special care may be needed. Overdosage: If you think you have taken too much of this medicine contact a poison control center or emergency room at once. NOTE: This medicine is only for you. Do not share this medicine with others. What if I miss a dose? If you miss a dose, take it as soon as you can. If it is almost time for your next dose, take only that dose. Do not take double or extra doses. What may interact with this medicine? Do not take this medicine with any of the following medications: -certain antiviral medicines for HIV or AIDS like delavirdine, indinavir -certain medicines for fungal infections like ketoconazole and  itraconazole -narcotic medicines for cough -sodium oxybate This medicine may also interact with the following medications: -alcohol -antihistamines for allergy, cough and cold -certain antibiotics like clarithromycin, erythromycin, isoniazid, rifampin, rifapentine, rifabutin, and troleandomycin -certain medicines for blood pressure, heart disease, irregular heart beat -certain medicines for depression, like amitriptyline, fluoxetine, sertraline -certain medicines for seizures like carbamazepine, oxcarbazepine, phenobarbital, phenytoin, primidone -cimetidine -cyclosporine -female hormones, like estrogens or progestins and birth control pills, patches, rings, or injections -general anesthetics like halothane, isoflurane, methoxyflurane, propofol -grapefruit juice -local anesthetics like lidocaine, pramoxine, tetracaine -medicines that relax muscles for surgery -narcotic medicines for pain -other antiviral medicines for HIV or AIDS -phenothiazines like chlorpromazine, mesoridazine, prochlorperazine, thioridazine This list may not describe all possible interactions. Give your health care provider a list of all the medicines, herbs, non-prescription drugs, or dietary supplements you use. Also tell them if you smoke, drink alcohol, or use illegal drugs. Some items may interact with your medicine. What should I watch for while using this medicine? Tell your doctor or health care professional if your symptoms do not start to get better or if they get worse. Do not stop taking except on your doctor's  advice. You may develop a severe reaction. Your doctor will tell you how much medicine to take. You may get drowsy or dizzy. Do not drive, use machinery, or do anything that needs mental alertness until you know how this medicine affects you. To reduce the risk of dizzy and fainting spells, do not stand or sit up quickly, especially if you are an older patient. Alcohol may increase dizziness and  drowsiness. Avoid alcoholic drinks. If you are taking another medicine that also causes drowsiness, you may have more side effects. Give your health care provider a list of all medicines you use. Your doctor will tell you how much medicine to take. Do not take more medicine than directed. Call emergency for help if you have problems breathing or unusual sleepiness. What side effects may I notice from receiving this medicine? Side effects that you should report to your doctor or health care professional as soon as possible: -allergic reactions like skin rash, itching or hives, swelling of the face, lips, or tongue -breathing problems -confusion -loss of balance or coordination -signs and symptoms of low blood pressure like dizziness; feeling faint or lightheaded, falls; unusually weak or tired -suicidal thoughts or other mood changes Side effects that usually do not require medical attention (report to your doctor or health care professional if they continue or are bothersome): -dizziness -dry mouth -nausea, vomiting -tiredness This list may not describe all possible side effects. Call your doctor for medical advice about side effects. You may report side effects to FDA at 1-800-FDA-1088. Where should I keep my medicine? Keep out of the reach of children. This medicine can be abused. Keep your medicine in a safe place to protect it from theft. Do not share this medicine with anyone. Selling or giving away this medicine is dangerous and against the law. Store at room temperature between 20 and 25 degrees C (68 and 77 degrees F). This medicine may cause accidental overdose and death if taken by other adults, children, or pets. Mix any unused medicine with a substance like cat litter or coffee grounds. Then throw the medicine away in a sealed container like a sealed bag or a coffee can with a lid. Do not use the medicine after the expiration date. NOTE: This sheet is a summary. It may not cover all  possible information. If you have questions about this medicine, talk to your doctor, pharmacist, or health care provider.  2018 Elsevier/Gold Standard (2015-05-23 13:47:25)    Propranolol tablets What is this medicine? PROPRANOLOL (proe PRAN oh lole) is a beta-blocker. Beta-blockers reduce the workload on the heart and help it to beat more regularly. This medicine is used to treat high blood pressure, to control irregular heart rhythms (arrhythmias) and to relieve chest pain caused by angina. It may also be helpful after a heart attack. This medicine is also used to prevent migraine headaches, relieve uncontrollable shaking (tremors), and help certain problems related to the thyroid gland and adrenal gland. This medicine may be used for other purposes; ask your health care provider or pharmacist if you have questions. COMMON BRAND NAME(S): Inderal What should I tell my health care provider before I take this medicine? They need to know if you have any of these conditions: -circulation problems or blood vessel disease -diabetes -history of heart attack or heart disease, vasospastic angina -kidney disease -liver disease -lung or breathing disease, like asthma or emphysema -pheochromocytoma -slow heart rate -thyroid disease -an unusual or allergic reaction to propranolol, other beta-blockers, medicines,  foods, dyes, or preservatives -pregnant or trying to get pregnant -breast-feeding How should I use this medicine? Take this medicine by mouth with a glass of water. Follow the directions on the prescription label. Take your doses at regular intervals. Do not take your medicine more often than directed. Do not stop taking except on your the advice of your doctor or health care professional. Talk to your pediatrician regarding the use of this medicine in children. Special care may be needed. Overdosage: If you think you have taken too much of this medicine contact a poison control center or  emergency room at once. NOTE: This medicine is only for you. Do not share this medicine with others. What if I miss a dose? If you miss a dose, take it as soon as you can. If it is almost time for your next dose, take only that dose. Do not take double or extra doses. What may interact with this medicine? Do not take this medicine with any of the following medications: -feverfew -phenothiazines like chlorpromazine, mesoridazine, prochlorperazine, thioridazine This medicine may also interact with the following medications: -aluminum hydroxide gel -antipyrine -antiviral medicines for HIV or AIDS -barbiturates like phenobarbital -certain medicines for blood pressure, heart disease, irregular heart beat -cimetidine -ciprofloxacin -diazepam -fluconazole -haloperidol -isoniazid -medicines for cholesterol like cholestyramine or colestipol -medicines for mental depression -medicines for migraine headache like almotriptan, eletriptan, frovatriptan, naratriptan, rizatriptan, sumatriptan, zolmitriptan -NSAIDs, medicines for pain and inflammation, like ibuprofen or naproxen -phenytoin -rifampin -teniposide -theophylline -thyroid medicines -tolbutamide -warfarin -zileuton This list may not describe all possible interactions. Give your health care provider a list of all the medicines, herbs, non-prescription drugs, or dietary supplements you use. Also tell them if you smoke, drink alcohol, or use illegal drugs. Some items may interact with your medicine. What should I watch for while using this medicine? Visit your doctor or health care professional for regular check ups. Check your blood pressure and pulse rate regularly. Ask your health care professional what your blood pressure and pulse rate should be, and when you should contact them. You may get drowsy or dizzy. Do not drive, use machinery, or do anything that needs mental alertness until you know how this drug affects you. Do not stand or  sit up quickly, especially if you are an older patient. This reduces the risk of dizzy or fainting spells. Alcohol can make you more drowsy and dizzy. Avoid alcoholic drinks. This medicine can affect blood sugar levels. If you have diabetes, check with your doctor or health care professional before you change your diet or the dose of your diabetic medicine. Do not treat yourself for coughs, colds, or pain while you are taking this medicine without asking your doctor or health care professional for advice. Some ingredients may increase your blood pressure. What side effects may I notice from receiving this medicine? Side effects that you should report to your doctor or health care professional as soon as possible: -allergic reactions like skin rash, itching or hives, swelling of the face, lips, or tongue -breathing problems -changes in blood sugar -cold hands or feet -difficulty sleeping, nightmares -dry peeling skin -hallucinations -muscle cramps or weakness -slow heart rate -swelling of the legs and ankles -vomiting Side effects that usually do not require medical attention (report to your doctor or health care professional if they continue or are bothersome): -change in sex drive or performance -diarrhea -dry sore eyes -hair loss -nausea -weak or tired This list may not describe all possible side effects.  Call your doctor for medical advice about side effects. You may report side effects to FDA at 1-800-FDA-1088. Where should I keep my medicine? Keep out of the reach of children. Store at room temperature between 15 and 30 degrees C (59 and 86 degrees F). Protect from light. Throw away any unused medicine after the expiration date. NOTE: This sheet is a summary. It may not cover all possible information. If you have questions about this medicine, talk to your doctor, pharmacist, or health care provider.  2018 Elsevier/Gold Standard (2013-04-28 14:51:53)     IF you received an  x-ray today, you will receive an invoice from [facility]. Please contact [facility] at [phone] with questions or concerns regarding your invoice.   IF you received labwork today, you will receive an invoice from [facility]. Please contact [facility] at [phone] with questions or concerns regarding your invoice.   Our billing staff will not be able to assist you with questions regarding bills from these companies.  You will be contacted with the lab results as soon as they are available. The fastest way to get your results is to activate your My Chart account. Instructions are located on the last page of this paperwork. If you have not heard from [place] regarding the results in 2 weeks, please contact this office.

## 2017-05-28 ENCOUNTER — Encounter: Payer: Self-pay | Admitting: Family Medicine

## 2017-05-28 ENCOUNTER — Ambulatory Visit (INDEPENDENT_AMBULATORY_CARE_PROVIDER_SITE_OTHER): Payer: PRIVATE HEALTH INSURANCE | Admitting: Family Medicine

## 2017-05-28 VITALS — BP 114/70 | HR 79 | Temp 98.6°F | Resp 16 | Ht 66.0 in | Wt 178.2 lb

## 2017-05-28 DIAGNOSIS — S91209A Unspecified open wound of unspecified toe(s) with damage to nail, initial encounter: Secondary | ICD-10-CM

## 2017-05-28 DIAGNOSIS — Z30011 Encounter for initial prescription of contraceptive pills: Secondary | ICD-10-CM | POA: Diagnosis not present

## 2017-05-28 MED ORDER — NORGESTIMATE-ETH ESTRADIOL 0.25-35 MG-MCG PO TABS: 1.0000 | ORAL_TABLET | Freq: Every day | ORAL | 4 refills | Status: DC

## 2017-05-28 NOTE — Progress Notes (Signed)
Patient ID: Samyra Limb, female    DOB: [birth year]  Age: 26 y.o. MRN: [id number]  Chief Complaint  Patient presents with  . Toe Injury    right GREAT TOE nail 05/27/2017  . Medication Refill    ORTHOCYCLEN    Subjective:   Patient stubbed her toe yesterday. She already had a fungal nail with some new nail growing out underneath it. The nail a pulsed until it was standing up at a 90 angle. She just cared for it in that position until she could come in today. It is hurting her some but not terribly. She is pretty stoic.  She also needs a prescription for birth control.  Current allergies, medications, problem list, past/family and social histories reviewed.  Objective:  BP 114/70 (BP Location: Right Arm, Patient Position: Sitting, Cuff Size: Large)   Pulse 79   Temp 98.6 F (37 C) (Oral)   Resp 16   Ht  (1.676 m)   Wt 178 lb 3.2 oz (80.8 kg)   LMP 05/17/2017   SpO2 97%   BMI 28.76 kg/m   No major acute distress. Toe is not infected looking. The following female has been partially avulsed.    Procedure note. Anesthetized with 2% lidocaine local block and a little additional around the base of the nail. Nail removed without difficulty. Dressed and instructed in its care. It is not infected looking on do not believe antibiotics aren't indicated and needed at this time.  Assessment & Plan:   Assessment: 1. Traumatic avulsion of nail plate of toe, initial encounter   2. Oral contraceptive prescribed       Plan: See instructions. Return as needed. Talk to her about how to care for her when she is working. Gave her some Levsin used to make cots to cover the tip when working.  No orders of the defined types were placed in this encounter.   Meds ordered this encounter  Medications  . norgestimate-ethinyl estradiol (ORTHO-CYCLEN,SPRINTEC,PREVIFEM) 0.25-35 MG-MCG tablet    Sig: Take 1 tablet by mouth daily.    Dispense:  3 Package    Refill:  4    Uses sprintec          Patient Instructions   Try to protect the toe from trauma over the coming couple of months until the nail has started regrowing.  Sunday you can clean it and redress it.  Ibuprofen or Aleve or Tylenol if needed for pain  It can take 6 months or more sometimes for a large toenail to grow the way back.  Your oral contraceptives have been represcribed.  Return as needed    IF you received an x-ray today, you will receive an invoice from [facility]. Please contact [facility] at [phone] with questions or concerns regarding your invoice.   IF you received labwork today, you will receive an invoice from [facility]. Please contact [facility] at [phone] with questions or concerns regarding your invoice.   Our billing staff will not be able to assist you with questions regarding bills from these companies.  You will be contacted with the lab results as soon as they are available. The fastest way to get your results is to activate your My Chart account. Instructions are located on the last page of this paperwork. If you have not heard from [place] regarding the results in 2 weeks, please contact this office.        Return if symptoms worsen or fail to improve.   Facundo Allemand,  MD 05/28/2017

## 2017-05-28 NOTE — Patient Instructions (Addendum)
Try to protect the toe from trauma over the coming couple of months until the nail has started regrowing.  Sunday you can clean it and redress it.  Ibuprofen or Aleve or Tylenol if needed for pain  It can take 6 months or more sometimes for a large toenail to grow the way back.  Your oral contraceptives have been represcribed.  Return as needed    IF you received an x-ray today, you will receive an invoice from [facility]. Please contact [facility] at [phone] with questions or concerns regarding your invoice.   IF you received labwork today, you will receive an invoice from [place] [facility]. Please contact [facility] at [phone] with questions or concerns regarding your invoice.   Our billing staff will not be able to assist you with questions regarding bills from these companies.  You will be contacted with the lab results as soon as they are available. The fastest way to get your results is to activate your My Chart account. Instructions are located on the last page of this paperwork. If you have not heard from [place] regarding the results in 2 weeks, please contact this office.

## 2017-10-28 ENCOUNTER — Encounter: Payer: Self-pay | Admitting: Urgent Care

## 2017-10-28 ENCOUNTER — Ambulatory Visit (INDEPENDENT_AMBULATORY_CARE_PROVIDER_SITE_OTHER): Payer: BLUE CROSS/BLUE SHIELD | Admitting: Urgent Care

## 2017-10-28 VITALS — BP 119/75 | HR 65 | Temp 98.0°F | Resp 18 | Ht 66.0 in | Wt 177.8 lb

## 2017-10-28 DIAGNOSIS — R05 Cough: Secondary | ICD-10-CM | POA: Diagnosis not present

## 2017-10-28 DIAGNOSIS — R51 Headache: Secondary | ICD-10-CM

## 2017-10-28 DIAGNOSIS — R070 Pain in throat: Secondary | ICD-10-CM

## 2017-10-28 DIAGNOSIS — R0989 Other specified symptoms and signs involving the circulatory and respiratory systems: Secondary | ICD-10-CM

## 2017-10-28 DIAGNOSIS — R519 Headache, unspecified: Secondary | ICD-10-CM

## 2017-10-28 DIAGNOSIS — J069 Acute upper respiratory infection, unspecified: Secondary | ICD-10-CM

## 2017-10-28 DIAGNOSIS — R059 Cough, unspecified: Secondary | ICD-10-CM

## 2017-10-28 DIAGNOSIS — H938X3 Other specified disorders of ear, bilateral: Secondary | ICD-10-CM | POA: Diagnosis not present

## 2017-10-28 MED ORDER — HYDROCODONE-HOMATROPINE 5-1.5 MG/5ML PO SYRP: 5.0000 mL | ORAL_SOLUTION | Freq: Every evening | ORAL | 0 refills | Status: DC | PRN

## 2017-10-28 MED ORDER — BENZONATATE 100 MG PO CAPS: 100.0000 mg | ORAL_CAPSULE | Freq: Three times a day (TID) | ORAL | 0 refills | Status: DC | PRN

## 2017-10-28 MED ORDER — PSEUDOEPHEDRINE HCL ER 120 MG PO TB12: 120.0000 mg | ORAL_TABLET | Freq: Two times a day (BID) | ORAL | 3 refills | Status: DC

## 2017-10-28 MED ORDER — DOXYCYCLINE HYCLATE 100 MG PO CAPS: 100.0000 mg | ORAL_CAPSULE | Freq: Two times a day (BID) | ORAL | 0 refills | Status: DC

## 2017-10-28 MED ORDER — CETIRIZINE HCL 10 MG PO TABS: 10.0000 mg | ORAL_TABLET | Freq: Every day | ORAL | 11 refills | Status: DC

## 2017-10-28 NOTE — Progress Notes (Signed)
   MRN: [id number] DOB: 03/30/1991  Subjective:   Lauren Jensen is a 27 y.o. female presenting for 8 day history of productive cough, chest congestion, sinus congestion, ear popping, sinus headache, chills. Denies fever, sinus pain, ear pain, chest pain, shob, n/v, abdominal pain, rashes. Has tried DayQuil, Airborne, Sinex. Denies smoking cigarettes. Has history of allergies, uses nasal steroid occasionally. Denies history of asthma.  Lauren Jensen has a current medication list which includes the following prescription(s): acyclovir, alprazolam, cyclobenzaprine, norgestimate-ethinyl estradiol, and propranolol. Also is allergic to penicillins.  Lauren Jensen  has a past medical history of Allergy, Anemia, Depression, and Heart murmur. Denies past surgical history.   Objective:   Vitals: BP 119/75   Pulse 65   Temp 98 F (36.7 C) (Oral)   Resp 18   Ht 5\' 6"  (1.676 m)   Wt 177 lb 12.8 oz (80.6 kg)   SpO2 100%   BMI 28.70 kg/m   Physical Exam  Constitutional: She is oriented to person, place, and time. She appears well-developed and well-nourished.  HENT:  TM's intact bilaterally, no effusions or erythema. Nasal turbinates pink, dry, nasal passages minimally patent. No sinus tenderness. Oropharynx clear, mucous membranes moist.    Eyes: Right eye exhibits no discharge. Left eye exhibits no discharge.  Neck: Normal range of motion. Neck supple.  Cardiovascular: Normal rate, regular rhythm and intact distal pulses. Exam reveals no gallop and no friction rub.  No murmur heard. Pulmonary/Chest: No respiratory distress. She has no wheezes. She has no rales.  Lymphadenopathy:    She has no cervical adenopathy.  Neurological: She is alert and oriented to person, place, and time.  Skin: Skin is warm and dry.  Psychiatric: She has a normal mood and affect.   Assessment and Plan :   Acute upper respiratory infection  Cough  Sinus headache  Popping of both ears  Chest congestion  Throat  pain  Will manage supportively. Patient provided with script for doxycycline given allergies with penicillin. She is to fill this if she has not improved by 11/01/2017. Counseled patient on potential for adverse effects with medications prescribed today, patient verbalized understanding. Return-to-clinic precautions discussed, patient verbalized understanding.   Lauren BambergMario Miakoda Mcmillion, PA-C [facility] [phone] 10/28/2017  3:49 PM

## 2017-10-28 NOTE — Patient Instructions (Addendum)
Hydrate well with at least 2 liters (1 gallon) of water daily. For sore throat try using a honey-based tea. Use 3 teaspoons of honey with juice squeezed from half lemon. Place shaved pieces of ginger into 1/2-1 cup of water and warm over stove top. Then mix the ingredients and repeat every 4 hours as needed. If you are not having any improvement with these medications by Monday, then you can fill the script for doxycycline to address bacterial infection.     Upper Respiratory Infection, Adult Most upper respiratory infections (URIs) are caused by a virus. A URI affects the nose, throat, and upper air passages. The most common type of URI is often called "the common cold." Follow these instructions at home:  Take medicines only as told by your doctor.  Gargle warm saltwater or take cough drops to comfort your throat as told by your doctor.  Use a warm mist humidifier or inhale steam from a shower to increase air moisture. This may make it easier to breathe.  Drink enough fluid to keep your pee (urine) clear or pale yellow.  Eat soups and other clear broths.  Have a healthy diet.  Rest as needed.  Go back to work when your fever is gone or your doctor says it is okay. ? You may need to stay home longer to avoid giving your URI to others. ? You can also wear a face mask and wash your hands often to prevent spread of the virus.  Use your inhaler more if you have asthma.  Do not use any tobacco products, including cigarettes, chewing tobacco, or electronic cigarettes. If you need help quitting, ask your doctor. Contact a doctor if:  You are getting worse, not better.  Your symptoms are not helped by medicine.  You have chills.  You are getting more short of breath.  You have brown or red mucus.  You have yellow or brown discharge from your nose.  You have pain in your face, especially when you bend forward.  You have a fever.  You have puffy (swollen) neck glands.  You  have pain while swallowing.  You have white areas in the back of your throat. Get help right away if:  You have very bad or constant: ? Headache. ? Ear pain. ? Pain in your forehead, behind your eyes, and over your cheekbones (sinus pain). ? Chest pain.  You have long-lasting (chronic) lung disease and any of the following: ? Wheezing. ? Long-lasting cough. ? Coughing up blood. ? A change in your usual mucus.  You have a stiff neck.  You have changes in your: ? Vision. ? Hearing. ? Thinking. ? Mood. This information is not intended to replace advice given to you by your health care provider. Make sure you discuss any questions you have with your health care provider. Document Released: 02/10/2008 Document Revised: 04/26/2016 Document Reviewed: 11/29/2013 Elsevier Interactive Patient Education  2018 [employer]     IF you received an x-ray today, you will receive an invoice from [facility]. Please contact [facility] at [phone] with questions or concerns regarding your invoice.   IF you received labwork today, you will receive an invoice from [facility]. Please contact [facility] at [phone] with questions or concerns regarding your invoice.   Our billing staff will not be able to assist you with questions regarding bills from these companies.  You will be contacted with the lab results as soon as they are available. The fastest way to get  your results is to activate your My Chart account. Instructions are located on the last page of this paperwork. If you have not heard from [place] regarding the results in 2 weeks, please contact this office.

## 2017-11-30 ENCOUNTER — Encounter: Payer: Self-pay | Admitting: Urgent Care

## 2017-11-30 ENCOUNTER — Other Ambulatory Visit: Payer: Self-pay

## 2017-11-30 ENCOUNTER — Ambulatory Visit: Payer: BLUE CROSS/BLUE SHIELD | Admitting: Urgent Care

## 2017-11-30 VITALS — BP 121/77 | HR 75 | Temp 98.7°F | Resp 17 | Ht 66.0 in | Wt 177.4 lb

## 2017-11-30 DIAGNOSIS — L299 Pruritus, unspecified: Secondary | ICD-10-CM | POA: Diagnosis not present

## 2017-11-30 DIAGNOSIS — L301 Dyshidrosis [pompholyx]: Secondary | ICD-10-CM | POA: Diagnosis not present

## 2017-11-30 DIAGNOSIS — F418 Other specified anxiety disorders: Secondary | ICD-10-CM

## 2017-11-30 MED ORDER — ALPRAZOLAM 0.25 MG PO TABS: 0.2500 mg | ORAL_TABLET | Freq: Two times a day (BID) | ORAL | 0 refills | Status: DC | PRN

## 2017-11-30 MED ORDER — HYDROXYZINE HCL 50 MG PO TABS: 50.0000 mg | ORAL_TABLET | Freq: Every evening | ORAL | 1 refills | Status: DC | PRN

## 2017-11-30 MED ORDER — TRIAMCINOLONE ACETONIDE 0.1 % EX CREA: 1.0000 "application " | TOPICAL_CREAM | Freq: Two times a day (BID) | CUTANEOUS | 0 refills | Status: DC

## 2017-11-30 NOTE — Progress Notes (Signed)
    MRN: [id number] DOB: 02/20/1991  Subjective:   Lauren Jensen is a 27 y.o. female presenting for 2 day history of itchy red rash over dorsal aspect of her hands. Patient works as a LawyerCNA, uses Control and instrumentation engineerhand sanitizer frequently, gloves, washes her hands as well as required clinically. Has used otc hydrocortisone cream, antihistamines with minimal relief. She also complains of a knot over right lower jaw. Denies fever, redness, pain with chewing, swallowing. Would like a refill of her Xanax. Does not use this frequently, ~2 times per month for situational anxiety.  Lauren Jensen has a current medication list which includes the following prescription(s): acyclovir, alprazolam, cetirizine, cyclobenzaprine, norgestimate-ethinyl estradiol, and propranolol. Also is allergic to penicillins.  Lauren Jensen  has a past medical history of Allergy, Anemia, Depression, and Heart murmur. Also  has no past surgical history on file.  Objective:   Vitals: BP 121/77 (BP Location: Right Arm, Patient Position: Sitting, Cuff Size: Normal)   Pulse 75   Temp 98.7 F (37.1 C) (Oral)   Resp 17   Ht 5\' 6"  (1.676 m)   Wt 177 lb 6.4 oz (80.5 kg)   LMP 11/08/2017   SpO2 96%   BMI 28.63 kg/m   Physical Exam  Constitutional: She is oriented to person, place, and time. She appears well-developed and well-nourished.  HENT:  Head:    Mouth/Throat: Oropharynx is clear and moist.  Neck: Normal range of motion. Neck supple.  Cardiovascular: Normal rate.  Pulmonary/Chest: Effort normal.  Lymphadenopathy:       Head (right side): No submental, no submandibular, no preauricular, no posterior auricular and no occipital adenopathy present.       Head (left side): No submental, no submandibular, no preauricular, no posterior auricular and no occipital adenopathy present.    She has no cervical adenopathy.       Right cervical: No superficial cervical, no deep cervical and no posterior cervical adenopathy present.      Left cervical: No  superficial cervical, no deep cervical and no posterior cervical adenopathy present.  Neurological: She is alert and oriented to person, place, and time.  Skin: Skin is warm and dry. Rash (dry, erythematous patches with some excoriations and resolving blister like lesions over dorsal aspect of both hands extending to proximal fingers) noted.  Psychiatric: She has a normal mood and affect.   Assessment and Plan :   Dyshidrotic eczema  Itching  Situational anxiety - Plan: ALPRAZolam (XANAX) 0.25 MG tablet  Counseled on management of dyshidrotic eczema. Work note provided. Recheck in 1 week if symptoms persist. Patient declined imaging, blood work for knot over her jaw due to cost burden. Will monitor symptoms. Refilled Xanax. Counseled patient on potential for adverse effects with medications prescribed today, patient verbalized understanding.   Wallis BambergMario Lakayla Barrington, PA-C Primary Care at [facility] [phone] 11/30/2017  5:52 PM

## 2017-11-30 NOTE — Patient Instructions (Addendum)
Dyshidrotic Eczema Dyshidrotic eczema (pompholyx) is a type of eczema that causes very itchy (pruritic), fluid-filled blisters (vesicles) to form on the hands and feet. It can affect people of any age, but is more common before the age of 40. There is no cure, but treatment and certain lifestyle changes can help relieve symptoms. What are the causes? The cause of this condition is not known. What increases the risk? You are more likely to develop this condition if:  You wash your hands frequently.  You have a personal history or family history of eczema, allergies, asthma, or hay fever.  You are allergic to metals such as nickel or cobalt.  You work with cement.  You smoke.  What are the signs or symptoms? Symptoms of this condition may affect the hands, feet, or both. Symptoms may come and go (recur), and may include:  Severe itching, which may happen before blisters appear.  Blisters. These may form suddenly. ? In the early stages, blisters may form near the fingertips. ? In severe cases, blisters may grow to large blister masses (bullae). ? Blisters resolve in 2-3 weeks without bursting. This is followed by a dry phase in which itching eases.  Pain and swelling.  Cracks or long, narrow openings (fissures) in the skin.  Severe dryness.  Ridges on the nails.  How is this diagnosed? This condition may be diagnosed based on:  A physical exam.  Your symptoms.  Your medical history.  Skin scrapings to rule out a fungal infection.  Testing a swab of fluid for bacteria (culture).  Removing and checking a small piece of skin (biopsy) in order to test for infection or to rule out other conditions.  Skin patch tests. These tests involve taking patches that contain possible allergens and placing them on your back. Your health care provider will wait a few days and then check to see if an allergic reaction occurred. These tests may be done if your health care provider suspects  allergic reactions, or to rule out other types of eczema.  You may be referred to a health care provider who specializes in the skin (dermatologist) to help diagnose and treat this condition. How is this treated? There is no cure for this condition, but treatment can help relieve symptoms. Depending on how many blisters you have and how severe they are, your health care provider may suggest:  Avoiding allergens, irritants, or triggers that worsen symptoms. This may involve lifestyle changes such as: ? Using different lotions or soaps. ? Avoiding hot weather or places that will cause you to sweat a lot. ? Managing stress with coping techniques such as relaxation and exercise, and asking for help when you need it. ? Diet changes as recommended by your health care provider.  Using a clean, damp towel (cool compress) to relieve symptoms.  Soaking in a bath that contains a type of salt that relieves irritation (aluminum acetate soaks).  Medicine taken by mouth to reduce itching (oral antihistamines).  Medicine applied to the skin to reduce swelling and irritation (topical corticosteroids).  Medicine that reduces the activity of the body's disease-fighting system (immunosuppressants) to treat inflammation. This may be given in severe cases.  Antibiotic medicines to treat bacterial infection.  Light therapy (phototherapy). This involves shining ultraviolet (UV) light on affected skin in order to reduce itchiness and inflammation.  Follow these instructions at home: Bathing and skin care  Wash skin gently. After bathing or washing your hands, pat your skin dry. Avoid rubbing your   skin.  Remove all jewelry before bathing. If the skin under the jewelry stays wet, blisters may form or get worse.  Apply cool compresses as told by your health care provider: ? Soak a clean towel in cool water. ? Wring out excess water until towel is damp. ? Place the towel over affected skin. Leave the towel on  for 20 minutes at a time, 2-3 times a day.  Use mild soaps, cleansers, and lotions that do not contain dyes, perfumes, or other irritants.  Keep your skin hydrated. To do this: ? Avoid very hot water. Take lukewarm baths or showers. ? Apply moisturizer within three minutes of bathing. This locks in moisture. Medicines  Take and apply over-the-counter and prescription medicines only as told by your health care provider.  If you were prescribed antibiotic medicine, take or apply it as told by your health care provider. Do not stop using the antibiotic even if you start to feel better. General instructions  Identify and avoid triggers and allergens.  Keep fingernails short to avoid breaking open the skin while scratching.  Use waterproof gloves to protect your hands when doing work that keeps your hands wet for a long time.  Wear socks to keep your feet dry.  Do not use any products that contain nicotine or tobacco, such as cigarettes and e-cigarettes. If you need help quitting, ask your health care provider.  Keep all follow-up visits as told by your health care provider. This is important. Contact a health care provider if:  You have symptoms that do not go away.  You have signs of infection, such as: ? Crusting, pus, or a bad smell. ? More redness, swelling, or pain. ? Increased warmth in the affected area. Summary  Dyshidrotic eczema (pompholyx) is a type of eczema that causes very itchy (pruritic), fluid-filled blisters (vesicles) to form on the hands and feet.  The cause of this condition is not known.  There is no cure for this condition, but treatment can help relieve symptoms. Treatment depends on how many blisters you have and how severe they are.  Use mild soaps, cleansers, and lotions that do not contain dyes, perfumes, or other irritants. Keep your skin hydrated. This information is not intended to replace advice given to you by your health care provider. Make sure  you discuss any questions you have with your health care provider. Document Released: 01/07/2017 Document Revised: 01/07/2017 Document Reviewed: 01/07/2017 Elsevier Interactive Patient Education  2018 [employer]     IF you received an x-ray today, you will receive an invoice from [facility]. Please contact [facility] at [phone] with questions or concerns regarding your invoice.   IF you received labwork today, you will receive an invoice from [facility]. Please contact [facility] at [phone] with questions or concerns regarding your invoice.   Our billing staff will not be able to assist you with questions regarding bills from these companies.  You will be contacted with the lab results as soon as they are available. The fastest way to get your results is to activate your My Chart account. Instructions are located on the last page of this paperwork. If you have not heard from us regarding the results in 2 weeks, please contact this office.

## 2017-12-23 ENCOUNTER — Ambulatory Visit: Payer: BLUE CROSS/BLUE SHIELD | Admitting: Urgent Care

## 2017-12-23 ENCOUNTER — Encounter: Payer: Self-pay | Admitting: Urgent Care

## 2017-12-23 VITALS — BP 118/81 | HR 82 | Temp 98.3°F | Resp 18 | Ht 66.0 in | Wt 177.2 lb

## 2017-12-23 DIAGNOSIS — R0789 Other chest pain: Secondary | ICD-10-CM | POA: Diagnosis not present

## 2017-12-23 DIAGNOSIS — R0982 Postnasal drip: Secondary | ICD-10-CM | POA: Diagnosis not present

## 2017-12-23 DIAGNOSIS — R05 Cough: Secondary | ICD-10-CM

## 2017-12-23 DIAGNOSIS — R0981 Nasal congestion: Secondary | ICD-10-CM | POA: Diagnosis not present

## 2017-12-23 DIAGNOSIS — Z9109 Other allergy status, other than to drugs and biological substances: Secondary | ICD-10-CM | POA: Diagnosis not present

## 2017-12-23 DIAGNOSIS — R6889 Other general symptoms and signs: Secondary | ICD-10-CM

## 2017-12-23 DIAGNOSIS — R059 Cough, unspecified: Secondary | ICD-10-CM

## 2017-12-23 MED ORDER — PREDNISONE 20 MG PO TABS: ORAL_TABLET | ORAL | 0 refills | Status: DC

## 2017-12-23 MED ORDER — FLUTICASONE PROPIONATE 50 MCG/ACT NA SUSP: 2.0000 | Freq: Every day | NASAL | 11 refills | Status: DC

## 2017-12-23 NOTE — Progress Notes (Signed)
    MRN: [id number] DOB: 01/18/1991  Subjective:   Lauren Jensen is a 27 y.o. female presenting for ongoing productive cough for several weeks now.  Patient also feels bilateral ear fullness, sore throat, postnasal drainage, sinus congestion.  She was last seen for similar symptoms October 28, 2017.  She started supportive care and eventually ended up taking doxycycline for sinusitis infection.  She reports that her symptoms did improve but for short duration.  She denies sinus pain, chest pain, nausea, vomiting, abdominal pain.  But she admits also having had loose stools recently.  Denies smoking cigarettes.  She does not take allergy medicine consistently.  She is inconsistent with her hydration as well.   Lauren Jensen has a current medication list which includes the following prescription(s): acyclovir, alprazolam, cetirizine, cyclobenzaprine, hydroxyzine, norgestimate-ethinyl estradiol, propranolol, and triamcinolone cream. Also is allergic to penicillins.  Lauren Jensen  has a past medical history of Allergy, Anemia, Depression, and Heart murmur. Denies past surgical history.  Objective:   Vitals: BP 118/81   Pulse 82   Temp 98.3 F (36.8 C) (Oral)   Resp 18   Ht 5\' 6"  (1.676 m)   Wt 177 lb 3.2 oz (80.4 kg)   SpO2 96%   BMI 28.60 kg/m    Physical Exam  Constitutional: She is oriented to person, place, and time. She appears well-developed and well-nourished.  HENT:  TMs opaque bilaterally but without erythema both are intact.  Throat with significant postnasal drainage and cobblestone pattern.  No sinus tenderness.  Eyes: Right eye exhibits no discharge. Left eye exhibits no discharge. No scleral icterus.  Cardiovascular: Normal rate, regular rhythm and intact distal pulses. Exam reveals no gallop and no friction rub.  No murmur heard. Pulmonary/Chest: No respiratory distress. She has no wheezes. She has no rales.  Neurological: She is alert and oriented to person, place, and time.  Skin:  Skin is warm and dry.    Assessment and Plan :   Sinus congestion  Post-nasal drainage  Throat congestion  Cough  Atypical chest pain  Environmental allergies  Counseled patient on importance of maintaining her allergy medications.  Will start short steroid course.  Counseled patient on potential for adverse effects with medications prescribed today, patient verbalized understanding.  Return to clinic precautions discussed.  Wallis BambergMario Xabi Wittler, PA-C Primary Care at [facility] [phone] 12/23/2017  3:42 PM

## 2017-12-23 NOTE — Patient Instructions (Addendum)
Hydrate well with at least 2 liters (1 gallon) of water daily. Use pseudoephedrine for nasal congestion, post-nasal drainage.     Cough, Adult Coughing is a reflex that clears your throat and your airways. Coughing helps to heal and protect your lungs. It is normal to cough occasionally, but a cough that happens with other symptoms or lasts a long time may be a sign of a condition that needs treatment. A cough may last only 2-3 weeks (acute), or it may last longer than 8 weeks (chronic). What are the causes? Coughing is commonly caused by:  Breathing in substances that irritate your lungs.  A viral or bacterial respiratory infection.  Allergies.  Asthma.  Postnasal drip.  Smoking.  Acid backing up from the stomach into the esophagus (gastroesophageal reflux).  Certain medicines.  Chronic lung problems, including COPD (or rarely, lung cancer).  Other medical conditions such as heart failure.  Follow these instructions at home: Pay attention to any changes in your symptoms. Take these actions to help with your discomfort:  Take medicines only as told by your health care provider. ? If you were prescribed an antibiotic medicine, take it as told by your health care provider. Do not stop taking the antibiotic even if you start to feel better. ? Talk with your health care provider before you take a cough suppressant medicine.  Drink enough fluid to keep your urine clear or pale yellow.  If the air is dry, use a cold steam vaporizer or humidifier in your bedroom or your home to help loosen secretions.  Avoid anything that causes you to cough at work or at home.  If your cough is worse at night, try sleeping in a semi-upright position.  Avoid cigarette smoke. If you smoke, quit smoking. If you need help quitting, ask your health care provider.  Avoid caffeine.  Avoid alcohol.  Rest as needed.  Contact a health care provider if:  You have new symptoms.  You cough up  pus.  Your cough does not get better after 2-3 weeks, or your cough gets worse.  You cannot control your cough with suppressant medicines and you are losing sleep.  You develop pain that is getting worse or pain that is not controlled with pain medicines.  You have a fever.  You have unexplained weight loss.  You have night sweats. Get help right away if:  You cough up blood.  You have difficulty breathing.  Your heartbeat is very fast. This information is not intended to replace advice given to you by your health care provider. Make sure you discuss any questions you have with your health care provider. Document Released: 02/20/2011 Document Revised: 01/30/2016 Document Reviewed: 10/31/2014 Elsevier Interactive Patient Education  2018 [employer].     Allergic Rhinitis, Adult Allergic rhinitis is an allergic reaction that affects the mucous membrane inside the nose. It causes sneezing, a runny or stuffy nose, and the feeling of mucus going down the back of the throat (postnasal drip). Allergic rhinitis can be mild to severe. There are two types of allergic rhinitis:  Seasonal. This type is also called hay fever. It happens only during certain seasons.  Perennial. This type can happen at any time of the year.  What are the causes? This condition happens when the body's defense system (immune system) responds to certain harmless substances called allergens as though they were germs.  Seasonal allergic rhinitis is triggered by pollen, which can come from grasses, trees, and weeds. Perennial allergic  rhinitis may be caused by:  House dust mites.  Pet dander.  Mold spores.  What are the signs or symptoms? Symptoms of this condition include:  Sneezing.  Runny or stuffy nose (nasal congestion).  Postnasal drip.  Itchy nose.  Tearing of the eyes.  Trouble sleeping.  Daytime sleepiness.  How is this diagnosed? This condition may be diagnosed based on:  Your  medical history.  A physical exam.  Tests to check for related conditions, such as: ? Asthma. ? Pink eye. ? Ear infection. ? Upper respiratory infection.  Tests to find out which allergens trigger your symptoms. These may include skin or blood tests.  How is this treated? There is no cure for this condition, but treatment can help control symptoms. Treatment may include:  Taking medicines that block allergy symptoms, such as antihistamines. Medicine may be given as a shot, nasal spray, or pill.  Avoiding the allergen.  Desensitization. This treatment involves getting ongoing shots until your body becomes less sensitive to the allergen. This treatment may be done if other treatments do not help.  If taking medicine and avoiding the allergen does not work, new, stronger medicines may be prescribed.  Follow these instructions at home:  Find out what you are allergic to. Common allergens include smoke, dust, and pollen.  Avoid the things you are allergic to. These are some things you can do to help avoid allergens: ? Replace carpet with wood, tile, or vinyl flooring. Carpet can trap dander and dust. ? Do not smoke. Do not allow smoking in your home. ? Change your heating and air conditioning filter at least once a month. ? During allergy season:  Keep windows closed as much as possible.  Plan outdoor activities when pollen counts are lowest. This is usually during the evening hours.  When coming indoors, change clothing and shower before sitting on furniture or bedding.  Take over-the-counter and prescription medicines only as told by your health care provider.  Keep all follow-up visits as told by your health care provider. This is important. Contact a health care provider if:  You have a fever.  You develop a persistent cough.  You make whistling sounds when you breathe (you wheeze).  Your symptoms interfere with your normal daily activities. Get help right away  if:  You have shortness of breath. Summary  This condition can be managed by taking medicines as directed and avoiding allergens.  Contact your health care provider if you develop a persistent cough or fever.  During allergy season, keep windows closed as much as possible. This information is not intended to replace advice given to you by your health care provider. Make sure you discuss any questions you have with your health care provider. Document Released: 05/19/2001 Document Revised: 10/01/2016 Document Reviewed: 10/01/2016 Elsevier Interactive Patient Education  2018 [employer]      IF you received an x-ray today, you will receive an invoice from [facility]. Please contact [facility] at [phone] with questions or concerns regarding your invoice.   IF you received labwork today, you will receive an invoice from [facility]. Please contact [facility] at [phone] with questions or concerns regarding your invoice.   Our billing staff will not be able to assist you with questions regarding bills from these companies.  You will be contacted with the lab results as soon as they are available. The fastest way to get your results is to activate your My Chart account. Instructions are located on the last page  of this paperwork. If you have not heard from [place] regarding the results in 2 weeks, please contact this office.

## 2018-03-23 ENCOUNTER — Ambulatory Visit: Payer: BLUE CROSS/BLUE SHIELD | Admitting: Physician Assistant

## 2018-08-16 DIAGNOSIS — J302 Other seasonal allergic rhinitis: Secondary | ICD-10-CM | POA: Insufficient documentation

## 2018-08-16 DIAGNOSIS — A6000 Herpesviral infection of urogenital system, unspecified: Secondary | ICD-10-CM | POA: Insufficient documentation

## 2018-08-16 DIAGNOSIS — F411 Generalized anxiety disorder: Secondary | ICD-10-CM | POA: Insufficient documentation

## 2018-08-16 HISTORY — DX: Other seasonal allergic rhinitis: J30.2

## 2018-09-08 ENCOUNTER — Encounter: Payer: BLUE CROSS/BLUE SHIELD | Admitting: Family Medicine

## 2018-09-08 ENCOUNTER — Encounter

## 2018-09-13 DIAGNOSIS — Z0000 Encounter for general adult medical examination without abnormal findings: Secondary | ICD-10-CM | POA: Insufficient documentation

## 2019-12-13 ENCOUNTER — Ambulatory Visit: Payer: Medicaid Other | Admitting: Podiatrist

## 2019-12-13 ENCOUNTER — Other Ambulatory Visit: Payer: Self-pay

## 2019-12-13 DIAGNOSIS — S90212A Contusion of left great toe with damage to nail, initial encounter: Secondary | ICD-10-CM | POA: Diagnosis not present

## 2019-12-13 DIAGNOSIS — L600 Ingrowing nail: Secondary | ICD-10-CM | POA: Diagnosis not present

## 2019-12-13 DIAGNOSIS — S90211A Contusion of right great toe with damage to nail, initial encounter: Secondary | ICD-10-CM | POA: Diagnosis not present

## 2019-12-13 DIAGNOSIS — B351 Tinea unguium: Secondary | ICD-10-CM

## 2019-12-13 NOTE — Patient Instructions (Addendum)
I will send off one of your nails to be sure its fungus and is able to be treated by the oral medicine-  We will call with the results-  If/when you are interested in taking this medication (after pregnancy and breast feeding)  Just call and we can set you up for lab tests and the medication.    Soak Instructions    THE DAY AFTER THE PROCEDURE  Place 1/4 cup of epsom salts in a quart of warm tap water.  Submerge your foot or feet with outer bandage intact for the initial soak; this will allow the bandage to become moist and wet for easy lift off.  Once you remove your bandage, continue to soak in the solution for 20 minutes.  This soak should be done twice a day.  Next, remove your foot or feet from solution, blot dry the affected area and cover.  You may use a band aid large enough to cover the area or use gauze and tape.  Apply other medications to the area as directed by the doctor such as polysporin neosporin.  IF YOUR SKIN BECOMES IRRITATED WHILE USING THESE INSTRUCTIONS, IT IS OKAY TO SWITCH TO  WHITE VINEGAR AND WATER. Or you may use antibacterial soap and water to keep the toe clean  Monitor for any signs/symptoms of infection. Call the office immediately if any occur or go directly to the emergency room. Call with any questions/concerns.

## 2019-12-13 NOTE — Progress Notes (Signed)
    Chief Complaint  Patient presents with  . Nail Problem    Bilateral 1st toenails loose and raising up. Left 1st multiple injuries over past 4 months, right 1st history of injury going back 7 years. Both are tender to touch.     HPI: Patient is 29 y.o. female who presents today for loosening of bilateral great toenails. She relates they have become loose and the left one is about to fall off.  She states they are thick and she wonders if they are fungal.  She is Pregnant.     Review of Systems  DATA OBTAINED: from patient  GENERAL: Feels well no fevers, no fatigue, no changes in appetite SKIN: No itching, no rashes, no open wounds EYES: No eye pain,no redness, no discharge EARS: No earache,no ringing of ears, NOSE: No congestion, no drainage, no bleeding  MOUTH/THROAT: No mouth pain, No sore throat, No difficulty chewing or swallowing  RESPIRATORY: No cough, no wheezing, no SOB CARDIAC: No chest pain,no heart palpitations, GI: No abdominal pain, No Nausea, no vomiting, no diarrhea, no heartburn or no reflux  GU: No dysuria, no increased frequency or urgency, Currently 7 mo pregnant MUSCULOSKELETAL: No unrelieved bone/joint pain,  NEUROLOGIC: Awake, alert, appropriate to situation, No change in mental status. PSYCHIATRIC: No overt anxiety or sadness.No behavior issue.      Physical Exam  GENERAL APPEARANCE: Alert, conversant. Appropriately groomed. No acute distress.   VASCULAR: Pedal pulses palpable DP and PT bilateral.  Capillary refill time is immediate to all digits,  Proximal to distal cooling it warm to warm.  Digital hair growth is present bilateral   NEUROLOGIC: sensation is intact epicritically and protectively to 5.07 monofilament at 5/5 sites bilateral.  Light touch is intact bilateral, vibratory sensation intact bilateral, achilles tendon reflex is intact bilateral.   MUSCULOSKELETAL: acceptable muscle strength, tone and stability bilateral.  Intrinsic muscluature  intact bilateral.  Range of motion at ankle and first MPJ is normal bilateral.   DERMATOLOGIC: skin is warm, supple, and dry.  No open lesions noted.  No interdigital maceration noted bilateral.  BIlateral hallux nails are thick and separated from the underlying nail bed.  No sign of bacterial infection noted.  Question fungal nails vs. Chronic trauma making the nails thick.   Digital nails are asymptomatifc.      Assessment     ICD-10-CM   1. Contusion of left great toe with damage to nail, initial encounter  S90.212A   2. Contusion of right great toe with damage to nail, initial encounter  S90.211A   3. Ingrowing nail  L60.0   4. Nail fungus  B35.1 Cult, Fungus, Skin,Hair,Nail w/KOH     Plan  Treatment options and alternatives discussed.  Recommended a non permanent nail removal due to the nails being loosely attached to the nail bed and patient agreed.  Bilateral halluces were prepped with alcohol and a 1 to 1 mix of 0.5% marcaine plain and 2% lidocaine plain was administered in a digital block fashion. A  betadine solution was applied and digits exsanguinated.  The offending nails were then removed and all necrotic tissue was resected.  The area was then cleansed well and antibiotic ointment and a dry sterile dressing was applied.  The patient was dispensed instructions for aftercare. I also sent the hallux nail off for fungal culture-  We will notify her of the result.

## 2019-12-17 ENCOUNTER — Encounter: Payer: Self-pay | Admitting: Podiatrist

## 2020-01-12 LAB — CULT, FUNGUS, SKIN,HAIR,NAIL W/KOH
CULTURE:: NO GROWTH
MICRO NUMBER:: 10337050
SMEAR:: NONE SEEN
SPECIMEN QUALITY:: ADEQUATE

## 2020-03-19 ENCOUNTER — Telehealth: Payer: Self-pay | Admitting: Podiatrist

## 2020-03-19 NOTE — Telephone Encounter (Signed)
My guarantor number is [phone]. I was just calling to make sure that Medicaid was billed for my visit. I got a bill for $727. Please call me back at [phone]. Thank you.

## 2020-12-18 LAB — CYTOLOGY - PAP: Pap: NEGATIVE

## 2021-07-29 LAB — OB RESULTS CONSOLE ABO/RH: RH Type: NEGATIVE

## 2021-07-29 LAB — OB RESULTS CONSOLE PLATELET COUNT: Platelets: 269

## 2021-07-29 LAB — OB RESULTS CONSOLE RPR: RPR: NONREACTIVE

## 2021-07-29 LAB — OB RESULTS CONSOLE HGB/HCT, BLOOD
HCT: 40 (ref 29–41)
Hemoglobin: 13.4

## 2021-07-29 LAB — OB RESULTS CONSOLE RUBELLA ANTIBODY, IGM: Rubella: IMMUNE

## 2021-07-29 LAB — CYSTIC FIBROSIS DIAGNOSTIC STUDY: Interpretation-CFDNA:: NEGATIVE

## 2021-07-29 LAB — HGB FRACTIONATION CASCADE
HGB A2: 2.8
HGB F: 0
HGB S: 0
Hgb A: 97.2

## 2021-07-29 LAB — OB RESULTS CONSOLE ANTIBODY SCREEN: Antibody Screen: NEGATIVE

## 2021-07-29 LAB — OB RESULTS CONSOLE HIV ANTIBODY (ROUTINE TESTING): HIV: NONREACTIVE

## 2021-07-29 LAB — HCV AB W REFLEX TO QUANT PCR: Reflex to HCV RT-PCR, Quant: NEGATIVE

## 2021-07-29 LAB — OB RESULTS CONSOLE HEPATITIS B SURFACE ANTIGEN: Hepatitis B Surface Ag: NEGATIVE

## 2021-08-14 ENCOUNTER — Telehealth: Payer: Self-pay

## 2021-08-14 NOTE — Telephone Encounter (Signed)
Called PT to start her New OB Intake , no answer, left VM for callback.

## 2021-08-20 ENCOUNTER — Telehealth (INDEPENDENT_AMBULATORY_CARE_PROVIDER_SITE_OTHER): Payer: Self-pay

## 2021-08-20 ENCOUNTER — Encounter: Payer: Self-pay | Admitting: *Deleted

## 2021-08-20 DIAGNOSIS — Z3492 Encounter for supervision of normal pregnancy, unspecified, second trimester: Secondary | ICD-10-CM

## 2021-08-20 DIAGNOSIS — Z3A00 Weeks of gestation of pregnancy not specified: Secondary | ICD-10-CM

## 2021-08-20 DIAGNOSIS — B009 Herpesviral infection, unspecified: Secondary | ICD-10-CM | POA: Insufficient documentation

## 2021-08-20 DIAGNOSIS — Z3480 Encounter for supervision of other normal pregnancy, unspecified trimester: Secondary | ICD-10-CM

## 2021-08-20 NOTE — Progress Notes (Addendum)
New OB Intake  I connected with  Lauren Jensen on 08/20/21 at  2:15 PM EST by MyChart Video Visit and verified that I am speaking with the correct person using two identifiers. Nurse is located at [facility] and pt is located at home.  I discussed the limitations, risks, security and privacy concerns of performing an evaluation and management service by telephone and the availability of in person appointments. I also discussed with the patient that there may be a patient responsible charge related to this service. The patient expressed understanding and agreed to proceed.  I explained I am completing New OB Intake today. Patient is transferring care from [facility] due to their practice not offering water birth. We discussed her EDD of 02/07/22 that is based on LMP of 05/03/21. Pt is G2/P1. I reviewed her allergies, medications, Medical/Surgical/OB history, and appropriate screenings. I informed her of [facility] services. Based on history, this is a low risk pregnancy.  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   MyChart/Babyscripts MyChart access verified. I explained pt will have some visits in office and some virtually. Babyscripts instructions given and order placed.   Blood Pressure Cuff  Has BP cuff at home. Explained after first prenatal appt pt will check weekly and document in Babyscripts.  Weight scale Patient has weight scale.  Anatomy US Explained first scheduled [place] will be around 19 weeks. Anatomy US scheduled for 09/18/20 at 1400.  Labs Labs completed at [facility]. Will be abstracted into chart. Pt also reports doing genetic screening at [facility].  COVID Vaccine Patient has not had COVID vaccine.    Centering in Pregnancy Candidate?  No - EDD June 2023  Mother/ Baby Dyad Candidate?    No - 1 living child  Social Determinants of Health Food Insecurity: Patient denies food insecurity. WIC Referral: Patient is interested in referral to [facility].  Transportation: Patient denies transportation needs. Childcare: Discussed no children allowed at ultrasound appointments. Offered childcare services;   First visit review I reviewed new OB appt with pt. I explained she will have a pelvic exam, ob bloodwork with genetic screening, and PAP smear. Explained pt will be seen by Luna Kitchens, CNM at first visit; encounter routed to appropriate provider. Explained that patient will be seen by pregnancy navigator following visit with provider.   Marjo Bicker, RN 08/20/2021  4:43 PM

## 2021-08-21 ENCOUNTER — Other Ambulatory Visit: Payer: Self-pay | Admitting: Student

## 2021-08-21 ENCOUNTER — Telehealth: Payer: Self-pay

## 2021-08-21 NOTE — Telephone Encounter (Signed)
Called pt to follow up on OB intake. Patient would like to ensure that employees not directly caring for her are unable to access chart. Explained that it is an option to make medical chart confidential due to working within [facility]. Pt agreeable to plan. Desires information regarding prior pregnancies not be discussed in front of family or visitors. IT ticket placed.

## 2021-09-07 NOTE — L&D Delivery Note (Signed)
Delivery Note Lauren Jensen is a 31 y.o. G3P1011 at [redacted]w[redacted]d admitted for elective IOL at term.   GBS Status: Negative/-- (05/10 1109) Maximum Maternal Temperature: 98.6  Labor course: Initial SVE: 1/50/-3. Augmentation with: AROM, Cytotec, and IP Foley. She then progressed to complete.  ROM: 1h 50m with clear fluid  Birth: At 1927 a viable female was delivered via spontaneous vaginal delivery in water (Presentation cephalic). Nuchal cord present: No.  Shoulders and body delivered in usual fashion. Infant placed directly on mom's abdomen for bonding/skin-to-skin, baby dried and stimulated. Cord clamped x 2 after 5 minutes and cut by FOB. Cord blood collected. The placenta separated spontaneously and delivered via gentle cord traction. Pitocin infused rapidly IV per protocol. Fundus firm with massage.  Placenta inspected and appears to be intact with a 3 VC.  Placenta/Cord with the following complications: none.  Cord pH: n/a Sponge and instrument count were correct x2.  Intrapartum complications:  None Anesthesia:  none Episiotomy: none Lacerations:  1st degree and periurethral both hemostatic and not repaired Suture Repair:  n/a EBL (mL): 50   Infant: APGAR (1 MIN): 8  APGAR (5 MINS): 9  Infant weight: pending  Mom to postpartum.  Baby to Couplet care / Skin to Skin. Placenta to L&D   Plans to Breastfeed Contraception: OCP (estrogen/progesterone) Circumcision: wants inpatient  Note sent to [facility]: [facility] for pp visit.  [place] 02/06/2022 7:54 PM

## 2021-09-10 NOTE — Progress Notes (Signed)
Chart reviewed for nurse visit. Agree with plan of care.   Marylene Land, CNM 09/10/2021 4:04 PM

## 2021-09-17 ENCOUNTER — Encounter: Payer: Self-pay | Admitting: Student

## 2021-09-17 ENCOUNTER — Ambulatory Visit (INDEPENDENT_AMBULATORY_CARE_PROVIDER_SITE_OTHER): Payer: Medicaid Other | Admitting: Student

## 2021-09-17 ENCOUNTER — Other Ambulatory Visit: Payer: Self-pay

## 2021-09-17 VITALS — BP 107/71 | HR 92 | Wt 193.0 lb

## 2021-09-17 DIAGNOSIS — Z3492 Encounter for supervision of normal pregnancy, unspecified, second trimester: Secondary | ICD-10-CM

## 2021-09-17 DIAGNOSIS — O26899 Other specified pregnancy related conditions, unspecified trimester: Secondary | ICD-10-CM | POA: Insufficient documentation

## 2021-09-17 DIAGNOSIS — Z3490 Encounter for supervision of normal pregnancy, unspecified, unspecified trimester: Secondary | ICD-10-CM | POA: Insufficient documentation

## 2021-09-17 DIAGNOSIS — Z3A19 19 weeks gestation of pregnancy: Secondary | ICD-10-CM

## 2021-09-17 DIAGNOSIS — Z6791 Unspecified blood type, Rh negative: Secondary | ICD-10-CM | POA: Insufficient documentation

## 2021-09-17 NOTE — Patient Instructions (Signed)
?  Considering Waterbirth? ?Guide for patients at [facility] ([facility]) ?Why consider waterbirth? ?Gentle birth for babies  ?Less pain medicine used in labor  ?May allow for passive descent/less pushing  ?May reduce perineal tears  ?More mobility and instinctive maternal position changes  ?Increased maternal relaxation  ? ?Is waterbirth safe? What are the risks of infection, drowning or other complications? ?Infection:  ?Very low risk (3.7 % for tub vs 4.8% for bed)  ?7 in 8000 waterbirths with documented infection  ?Poorly cleaned equipment most common cause  ?Slightly lower group B strep transmission rate  ?Drowning  ?Maternal:  ?Very low risk  ?Related to seizures or fainting  ?Newborn:  ?Very low risk. No evidence of increased risk of respiratory problems in multiple large studies  ?Physiological protection from breathing under water  ?Avoid underwater birth if there are any fetal complications  ?Once baby's head is out of the water, keep it out.  ?Birth complication  ?Some reports of cord trauma, but risk decreased by bringing baby to surface gradually  ?No evidence of increased risk of shoulder dystocia. Mothers can usually change positions faster in water than in a bed, possibly aiding the maneuvers to free the shoulder.  ? ?There are 2 things you MUST do to have a waterbirth with [facility]: ?Attend a [facility] class [facility] at    ?3rd Wednesday of every month from 7-9 pm (virtual during COVID) ?Free ?Register online at [email or web address] or [email or web address] or by calling [phone] ?Bring us the certificate from the class to your prenatal appointment or send via MyChart ?Meet with a midwife at 36 weeks* to see if you can still plan a waterbirth and to sign the consent.  ? ?*We also recommend that you schedule as many of your prenatal visits with a midwife as possible.   ? ?Helpful information: ?You may want to bring a bathing suit top to the hospital  to wear during labor but this is optional.  All other supplies are provided by the hospital. ?Please arrive at the hospital with signs of active labor, and do not wait at home until late in labor. It takes 45 min- 2 hours for COVID testing, fetal monitoring, and check in to your room to take place, plus transport and filling of the waterbirth tub.   ? ?Things that would prevent you from having a waterbirth: ?Unknown or Positive COVID-19 diagnosis upon admission to hospital* ?Premature, <37wks  ?Previous cesarean birth  ?Presence of thick meconium-stained fluid  ?Multiple gestation (Twins, triplets, etc.)  ?Uncontrolled diabetes or gestational diabetes requiring medication  ?Hypertension diagnosed in pregnancy or preexisting hypertension (gestational hypertension, preeclampsia, or chronic hypertension) ?Fetal growth restriction (your baby measures less than 10th percentile on ultrasound) ?Heavy vaginal bleeding  ?Non-reassuring fetal heart rate  ?Active infection (MRSA, etc.). Group B Strep is NOT a contraindication for waterbirth.  ?If your labor has to be induced and induction method requires continuous monitoring of the baby's heart rate  ?Other risks/issues identified by your obstetrical provider  ? ?Please remember that birth is unpredictable. Under certain unforeseeable circumstances your provider may advise against giving birth in the tub. These decisions will be made on a case-by-case basis and with the safety of you and your baby as our highest priority. ? ? ?*Please remember that in order to have a waterbirth, you must test Negative to COVID-19 upon admission to the hospital. ? ?Updated 12/16/20 ? ?

## 2021-09-17 NOTE — Progress Notes (Signed)
°  Subjective:    Lauren Jensen is being seen today for her first obstetrical visit. She was receivig care at [facility] but transferred to [facility] due to desire for WB.  This is a planned pregnancy. She is at 105w4d gestation. Her obstetrical history is significant for  HSV-2 infection; no outbreak since October . Relationship with FOB: significant other, living together. Patient does intend to breast feed. Pregnancy history fully reviewed. Last baby was at [facility] and feels like she did not get any support with positioning and breathing.  Patient reports no complaints other than occasional nausea and some abdominal cramping.  Review of Systems:   Review of Systems  Constitutional: Negative.   HENT: Negative.    Respiratory: Negative.    Cardiovascular: Negative.   Gastrointestinal: Negative.   Genitourinary: Negative.   Musculoskeletal: Negative.   Neurological: Negative.   Psychiatric/Behavioral: Negative.     Objective:     BP 107/71    Pulse 92    Wt 193 lb (87.5 kg)    LMP 05/03/2021    Breastfeeding Unknown    BMI 31.15 kg/m  Physical Exam Constitutional:      Appearance: Normal appearance.  HENT:     Head: Normocephalic.  Pulmonary:     Effort: Pulmonary effort is normal.  Skin:    General: Skin is warm.  Neurological:     General: No focal deficit present.     Mental Status: She is alert.  Psychiatric:        Mood and Affect: Mood normal.    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 Prenatal vitamins. Problem list reviewed and updated. AFP3 discussed: ordered. Role of ultrasound in pregnancy discussed; fetal survey: ordered. Amniocentesis discussed: not indicated. Follow up in 4 weeks. 75% of 30 min visit  spent on counseling, review of labs and coordination of care.  -interested in [place] ; discussed risk factors that would mean she cannot have WB, including lack of CNM availability,  patient verbalized understanding. Patient given instructions on WB class -records reviewed on patient's phone from her OB visits at [facility] and OB box updated   Charlesetta Garibaldi [facility] 09/17/2021

## 2021-09-17 NOTE — Progress Notes (Signed)
Patient reports cramps in lower right side of abdomen

## 2021-09-18 ENCOUNTER — Ambulatory Visit: Payer: Medicaid Other | Attending: Student

## 2021-09-18 DIAGNOSIS — O99212 Obesity complicating pregnancy, second trimester: Secondary | ICD-10-CM | POA: Insufficient documentation

## 2021-09-18 DIAGNOSIS — Z3A19 19 weeks gestation of pregnancy: Secondary | ICD-10-CM | POA: Insufficient documentation

## 2021-09-18 DIAGNOSIS — Z3492 Encounter for supervision of normal pregnancy, unspecified, second trimester: Secondary | ICD-10-CM

## 2021-09-19 ENCOUNTER — Other Ambulatory Visit: Payer: Self-pay | Admitting: *Deleted

## 2021-09-19 DIAGNOSIS — Z6830 Body mass index (BMI) 30.0-30.9, adult: Secondary | ICD-10-CM

## 2021-09-19 LAB — AFP, SERUM, OPEN SPINA BIFIDA
AFP MoM: 1.35
AFP Value: 63.1 ng/mL
Gest. Age on Collection Date: 19.4 weeks
Maternal Age At EDD: 30.7 yr
OSBR Risk 1 IN: 4151
Test Results:: NEGATIVE
Weight: 193 [lb_av]

## 2021-09-21 ENCOUNTER — Encounter: Payer: Self-pay | Admitting: Student

## 2021-09-21 DIAGNOSIS — O4420 Partial placenta previa NOS or without hemorrhage, unspecified trimester: Secondary | ICD-10-CM | POA: Insufficient documentation

## 2021-10-16 ENCOUNTER — Encounter: Payer: Self-pay | Admitting: Obstetrics and Gynecology

## 2021-10-16 ENCOUNTER — Other Ambulatory Visit: Payer: Self-pay

## 2021-10-16 ENCOUNTER — Ambulatory Visit: Payer: Medicaid Other | Admitting: *Deleted

## 2021-10-16 ENCOUNTER — Ambulatory Visit (INDEPENDENT_AMBULATORY_CARE_PROVIDER_SITE_OTHER): Payer: Medicaid Other | Admitting: Obstetrics and Gynecology

## 2021-10-16 ENCOUNTER — Ambulatory Visit: Payer: Medicaid Other | Attending: Maternal & Fetal Medicine

## 2021-10-16 ENCOUNTER — Other Ambulatory Visit: Payer: Self-pay | Admitting: *Deleted

## 2021-10-16 VITALS — BP 108/68 | HR 65 | Wt 197.6 lb

## 2021-10-16 VITALS — BP 115/64 | HR 74

## 2021-10-16 DIAGNOSIS — O43192 Other malformation of placenta, second trimester: Secondary | ICD-10-CM

## 2021-10-16 DIAGNOSIS — O321XX0 Maternal care for breech presentation, not applicable or unspecified: Secondary | ICD-10-CM | POA: Diagnosis not present

## 2021-10-16 DIAGNOSIS — Z6830 Body mass index (BMI) 30.0-30.9, adult: Secondary | ICD-10-CM

## 2021-10-16 DIAGNOSIS — E668 Other obesity: Secondary | ICD-10-CM | POA: Diagnosis not present

## 2021-10-16 DIAGNOSIS — O99212 Obesity complicating pregnancy, second trimester: Secondary | ICD-10-CM | POA: Diagnosis not present

## 2021-10-16 DIAGNOSIS — Z3492 Encounter for supervision of normal pregnancy, unspecified, second trimester: Secondary | ICD-10-CM

## 2021-10-16 DIAGNOSIS — Z3A23 23 weeks gestation of pregnancy: Secondary | ICD-10-CM | POA: Insufficient documentation

## 2021-10-16 DIAGNOSIS — Z6791 Unspecified blood type, Rh negative: Secondary | ICD-10-CM

## 2021-10-16 DIAGNOSIS — O43122 Velamentous insertion of umbilical cord, second trimester: Secondary | ICD-10-CM | POA: Diagnosis not present

## 2021-10-16 DIAGNOSIS — O4420 Partial placenta previa NOS or without hemorrhage, unspecified trimester: Secondary | ICD-10-CM

## 2021-10-16 DIAGNOSIS — B009 Herpesviral infection, unspecified: Secondary | ICD-10-CM

## 2021-10-16 DIAGNOSIS — O26899 Other specified pregnancy related conditions, unspecified trimester: Secondary | ICD-10-CM

## 2021-10-16 NOTE — Patient Instructions (Signed)

## 2021-10-16 NOTE — Progress Notes (Signed)
Subjective:  Lauren Jensen is a 31 y.o. G3P1011 at [redacted]w[redacted]d being seen today for ongoing prenatal care.  She is currently monitored for the following issues for this low-risk pregnancy and has GAD (generalized anxiety disorder); Seasonal allergies; HSV-2 infection; Supervision of low-risk pregnancy; Rh negative state in antepartum period; and Marginal placenta on their problem list.  Patient reports backache.  Contractions: Not present. Vag. Bleeding: None.  Movement: Present. Denies leaking of fluid.   The following portions of the patient's history were reviewed and updated as appropriate: allergies, current medications, past family history, past medical history, past social history, past surgical history and problem list. Problem list updated.  Objective:   Vitals:   10/16/21 1548  BP: 108/68  Pulse: 65  Weight: 197 lb 9.6 oz (89.6 kg)    Fetal Status: Fetal Heart Rate (bpm): 152   Movement: Present     General:  Alert, oriented and cooperative. Patient is in no acute distress.  Skin: Skin is warm and dry. No rash noted.   Cardiovascular: Normal heart rate noted  Respiratory: Normal respiratory effort, no problems with respiration noted  Abdomen: Soft, gravid, appropriate for gestational age. Pain/Pressure: Absent     Pelvic:  Cervical exam deferred        Extremities: Normal range of motion.  Edema: None  Mental Status: Normal mood and affect. Normal behavior. Normal judgment and thought content.   Urinalysis:      Assessment and Plan:  Pregnancy: G3P1011 at [redacted]w[redacted]d  1. Encounter for supervision of low-risk pregnancy in second trimester Stable Glucola next week  2. Rh negative state in antepartum period Rhogam as indicated  3. HSV-2 infection Suppression at 36 weeks  4. Marginal placenta Serial growth scans as per MFM  Preterm labor symptoms and general obstetric precautions including but not limited to vaginal bleeding, contractions, leaking of fluid and fetal movement  were reviewed in detail with the patient. Please refer to After Visit Summary for other counseling recommendations.  Return in about 4 weeks (around 11/13/2021) for OB visit, face to face, NMW to disacuss water birth, fasting for Glucola.   Hermina Staggers, MD

## 2021-11-11 DIAGNOSIS — Z3492 Encounter for supervision of normal pregnancy, unspecified, second trimester: Secondary | ICD-10-CM

## 2021-11-19 ENCOUNTER — Encounter: Payer: Self-pay | Admitting: Family Medicine

## 2021-11-19 ENCOUNTER — Other Ambulatory Visit: Payer: Medicaid Other

## 2021-11-19 ENCOUNTER — Ambulatory Visit (INDEPENDENT_AMBULATORY_CARE_PROVIDER_SITE_OTHER): Payer: Medicaid Other | Admitting: Family Medicine

## 2021-11-19 ENCOUNTER — Other Ambulatory Visit: Payer: Self-pay | Admitting: General Practice

## 2021-11-19 ENCOUNTER — Other Ambulatory Visit: Payer: Self-pay

## 2021-11-19 VITALS — BP 108/59 | HR 70 | Wt 201.0 lb

## 2021-11-19 DIAGNOSIS — Z3493 Encounter for supervision of normal pregnancy, unspecified, third trimester: Secondary | ICD-10-CM

## 2021-11-19 DIAGNOSIS — B009 Herpesviral infection, unspecified: Secondary | ICD-10-CM

## 2021-11-19 DIAGNOSIS — O4420 Partial placenta previa NOS or without hemorrhage, unspecified trimester: Secondary | ICD-10-CM

## 2021-11-19 DIAGNOSIS — Z3492 Encounter for supervision of normal pregnancy, unspecified, second trimester: Secondary | ICD-10-CM

## 2021-11-19 DIAGNOSIS — O26899 Other specified pregnancy related conditions, unspecified trimester: Secondary | ICD-10-CM | POA: Diagnosis not present

## 2021-11-19 DIAGNOSIS — Z6791 Unspecified blood type, Rh negative: Secondary | ICD-10-CM | POA: Diagnosis not present

## 2021-11-19 DIAGNOSIS — Z3A28 28 weeks gestation of pregnancy: Secondary | ICD-10-CM

## 2021-11-19 MED ORDER — RHO D IMMUNE GLOBULIN 1500 UNIT/2ML IJ SOSY
300.0000 ug | PREFILLED_SYRINGE | Freq: Once | INTRAMUSCULAR | Status: AC
  Administered 2021-11-19: 300 ug via INTRAMUSCULAR

## 2021-11-19 NOTE — Progress Notes (Signed)
? ? ?  Subjective:  ?Lauren Jensen is a 31 y.o. G3P1011 at [redacted]w[redacted]d being seen today for ongoing prenatal care.  She is currently monitored for the following issues for this low-risk pregnancy and has GAD (generalized anxiety disorder); Seasonal allergies; HSV-2 infection; Supervision of low-risk pregnancy; Rh negative state in antepartum period; and Marginal placenta on their problem list. ? ?Patient reports no complaints.  Contractions: Not present. Vag. Bleeding: None.  Movement: Present. Denies leaking of fluid.  ? ?The following portions of the patient's history were reviewed and updated as appropriate: allergies, current medications, past family history, past medical history, past social history, past surgical history and problem list.  ? ?Objective:  ? ?Vitals:  ? 11/19/21 0843  ?BP: (!) 108/59  ?Pulse: 70  ?Weight: 201 lb (91.2 kg)  ? ? ?Fetal Status: Fetal Heart Rate (bpm): 139  Fundal Height: 28 cm Movement: Present    ? ?General:  Alert, oriented and cooperative. Patient is in no acute distress.  ?Skin: Skin is warm and dry. No rash noted.   ?Cardiovascular: Normal heart rate noted  ?Respiratory: Normal respiratory effort, no problems with respiration noted  ?Abdomen: Soft, gravid, appropriate for gestational age. Pain/Pressure: Present     ?Pelvic:  Cervical exam deferred        ?Extremities: Normal range of motion.  Edema: Trace  ?Mental Status: Normal mood and affect. Normal behavior. Normal judgment and thought content.  ? ? ?Assessment and Plan:  ?Pregnancy: G3P1011 at [redacted]w[redacted]d ? ?1. Encounter for supervision of low-risk pregnancy in second trimester ?Doing well with normal fetal movement.  ? ?2. [redacted] weeks gestation of pregnancy ?Glucola and 3rd trimester labs today.  ? ?3. Rh negative state in antepartum period ?- rho (d) immune globulin (RHIG/RHOPHYLAC) injection 300 mcg ? ?4. Marginal placenta ?Growth [place] tomorrow.  ? ?5. HSV-2 infection ?Start valtrex at 36 weeks  ? ?6. Desires water birth  ?Already taken  the water birth class, going to bring in her certificate next appt. Discussed expectations and possible exclusions (continued augmentation, FHT abnormalities, HTN/GDM etc). Will make next appt with a CNM.  ? ?Preterm labor symptoms and general obstetric precautions including but not limited to vaginal bleeding, contractions, leaking of fluid and fetal movement were reviewed in detail with the patient. ?Please refer to After Visit Summary for other counseling recommendations.  ?Return in about 2 weeks (around 12/03/2021) for LROB with CNM . ? ? ?Allayne Stack, DO ?

## 2021-11-20 ENCOUNTER — Encounter: Payer: Self-pay | Admitting: *Deleted

## 2021-11-20 ENCOUNTER — Ambulatory Visit: Payer: Medicaid Other | Attending: Obstetrics and Gynecology

## 2021-11-20 ENCOUNTER — Ambulatory Visit: Payer: Medicaid Other | Admitting: *Deleted

## 2021-11-20 ENCOUNTER — Other Ambulatory Visit: Payer: Self-pay | Admitting: *Deleted

## 2021-11-20 VITALS — BP 114/59 | HR 79

## 2021-11-20 DIAGNOSIS — O43192 Other malformation of placenta, second trimester: Secondary | ICD-10-CM | POA: Insufficient documentation

## 2021-11-20 DIAGNOSIS — O43193 Other malformation of placenta, third trimester: Secondary | ICD-10-CM | POA: Diagnosis not present

## 2021-11-20 DIAGNOSIS — O99213 Obesity complicating pregnancy, third trimester: Secondary | ICD-10-CM

## 2021-11-20 DIAGNOSIS — O99212 Obesity complicating pregnancy, second trimester: Secondary | ICD-10-CM | POA: Diagnosis present

## 2021-11-20 DIAGNOSIS — Z3A28 28 weeks gestation of pregnancy: Secondary | ICD-10-CM | POA: Diagnosis not present

## 2021-11-20 LAB — CBC
Hematocrit: 34.8 % (ref 34.0–46.6)
Hemoglobin: 11.8 g/dL (ref 11.1–15.9)
MCH: 31.5 pg (ref 26.6–33.0)
MCHC: 33.9 g/dL (ref 31.5–35.7)
MCV: 93 fL (ref 79–97)
Platelets: 241 10*3/uL (ref 150–450)
RBC: 3.75 x10E6/uL — ABNORMAL LOW (ref 3.77–5.28)
RDW: 13 % (ref 11.7–15.4)
WBC: 8.5 10*3/uL (ref 3.4–10.8)

## 2021-11-20 LAB — GLUCOSE TOLERANCE, 2 HOURS W/ 1HR
Glucose, 1 hour: 171 mg/dL (ref 70–179)
Glucose, 2 hour: 126 mg/dL (ref 70–152)
Glucose, Fasting: 81 mg/dL (ref 70–91)

## 2021-11-20 LAB — ANTIBODY SCREEN: Antibody Screen: NEGATIVE

## 2021-11-20 LAB — RPR: RPR Ser Ql: NONREACTIVE

## 2021-11-20 LAB — HIV ANTIBODY (ROUTINE TESTING W REFLEX): HIV Screen 4th Generation wRfx: NONREACTIVE

## 2021-11-20 NOTE — Progress Notes (Unsigned)
u

## 2021-12-10 ENCOUNTER — Ambulatory Visit (INDEPENDENT_AMBULATORY_CARE_PROVIDER_SITE_OTHER): Payer: Medicaid Other | Admitting: Certified Nurse Midwife

## 2021-12-10 ENCOUNTER — Encounter: Payer: Self-pay | Admitting: General Practice

## 2021-12-10 VITALS — BP 112/63 | HR 87 | Wt 203.9 lb

## 2021-12-10 DIAGNOSIS — B009 Herpesviral infection, unspecified: Secondary | ICD-10-CM

## 2021-12-10 DIAGNOSIS — O26899 Other specified pregnancy related conditions, unspecified trimester: Secondary | ICD-10-CM

## 2021-12-10 DIAGNOSIS — Z3A31 31 weeks gestation of pregnancy: Secondary | ICD-10-CM

## 2021-12-10 DIAGNOSIS — Z6791 Unspecified blood type, Rh negative: Secondary | ICD-10-CM

## 2021-12-10 DIAGNOSIS — Z3493 Encounter for supervision of normal pregnancy, unspecified, third trimester: Secondary | ICD-10-CM

## 2021-12-10 DIAGNOSIS — O4420 Partial placenta previa NOS or without hemorrhage, unspecified trimester: Secondary | ICD-10-CM

## 2021-12-10 MED ORDER — VALACYCLOVIR HCL 1 G PO TABS: 1000.0000 mg | ORAL_TABLET | Freq: Every day | ORAL | 1 refills | Status: DC

## 2021-12-11 NOTE — Progress Notes (Signed)
? ?  PRENATAL VISIT NOTE ? ?Subjective:  ?Lauren Jensen is a 31 y.o. G3P1011 at [redacted]w[redacted]d being seen today for ongoing prenatal care.  She is currently monitored for the following issues for this low-risk pregnancy and has GAD (generalized anxiety disorder); Seasonal allergies; HSV-2 infection; Supervision of low-risk pregnancy; Rh negative state in antepartum period; and Marginal placenta on their problem list. ? ?Patient reports  pelvic pressure and discomfort especially at work (works as Consulting civil engineer at [employer]) .  Contractions: Not present. Vag. Bleeding: None.  Movement: Present. Denies leaking of fluid.  ? ?The following portions of the patient's history were reviewed and updated as appropriate: allergies, current medications, past family history, past medical history, past social history, past surgical history and problem list.  ? ?Objective:  ? ?Vitals:  ? 12/10/21 0826  ?BP: 112/63  ?Pulse: 87  ?Weight: 203 lb 14.4 oz (92.5 kg)  ? ?Fetal Status: Fetal Heart Rate (bpm): 135 Fundal Height: 31 cm Movement: Present    ? ?General:  Alert, oriented and cooperative. Patient is in no acute distress.  ?Skin: Skin is warm and dry. No rash noted.   ?Cardiovascular: Normal heart rate noted  ?Respiratory: Normal respiratory effort, no problems with respiration noted  ?Abdomen: Soft, gravid, appropriate for gestational age.  Pain/Pressure: Present     ?Pelvic: Cervical exam deferred        ?Extremities: Normal range of motion.  Edema: Trace  ?Mental Status: Normal mood and affect. Normal behavior. Normal judgment and thought content.  ? ?Assessment and Plan:  ?Pregnancy: G3P1011 at [redacted]w[redacted]d ?1. Encounter for supervision of low-risk pregnancy in third trimester ?- Doing well, feeling regular and vigorous fetal movement  ?- Advised use of pregnancy belt and how to use, as well as compression socks and daily stretching ? ?2. [redacted] weeks gestation of pregnancy ?- Routine OB care  ?- Completed waterbirth class and brought in certificate today.  ?-  Pt desires waterbirth because she had an unmedicated delivery last time and it was very difficult pushing on her back. Only 8hrs total, wants a different experience. ?- Advised even if not in the water, we can be aware of her desires to push in a more comfortable position. ?- Reviewed reasons for risking out when she presents in labor, timing of getting tub set up, and importance of trusting CNM if asked to get out of the tub. Also reviewed risks associated with waterbirth, pt understanding and signed consent. ? ?3. Marginal placenta ?- Being followed by MFM ? ?4. Rh negative state in antepartum period ?- Rhogam given 11/19/21 ? ?5. HSV-2 infection ?- Advised to start at 36 weeks ?- valACYclovir (VALTREX) 1000 MG tablet; Take 1 tablet (1,000 mg total) by mouth daily.  Dispense: 30 tablet; Refill: 1 ? ?Preterm labor symptoms and general obstetric precautions including but not limited to vaginal bleeding, contractions, leaking of fluid and fetal movement were reviewed in detail with the patient. ?Please refer to After Visit Summary for other counseling recommendations.  ? ?Return in about 2 weeks (around 12/24/2021) for IN-PERSON, LOB. ? ?Future Appointments  ?Date Time Provider Department Center  ?12/18/2021 12:30 PM [facility] NURSE [facility]  ?12/18/2021 12:45 PM [facility] US4 [facility]  ?12/26/2021  9:15 AM Bernerd Limbo, CNM [facility]  ?01/07/2022  9:35 AM Bernerd Limbo, CNM [facility]  ?01/14/2022  8:35 AM Bernerd Limbo, CNM [facility]  ? ? ?Bernerd Limbo, CNM ?

## 2021-12-18 ENCOUNTER — Other Ambulatory Visit: Payer: Self-pay | Admitting: *Deleted

## 2021-12-18 ENCOUNTER — Ambulatory Visit: Payer: Medicaid Other | Admitting: *Deleted

## 2021-12-18 ENCOUNTER — Ambulatory Visit: Payer: Medicaid Other | Attending: Maternal & Fetal Medicine

## 2021-12-18 VITALS — BP 117/73 | HR 88

## 2021-12-18 DIAGNOSIS — O43193 Other malformation of placenta, third trimester: Secondary | ICD-10-CM

## 2021-12-18 DIAGNOSIS — E669 Obesity, unspecified: Secondary | ICD-10-CM

## 2021-12-18 DIAGNOSIS — O43123 Velamentous insertion of umbilical cord, third trimester: Secondary | ICD-10-CM | POA: Insufficient documentation

## 2021-12-18 DIAGNOSIS — O99213 Obesity complicating pregnancy, third trimester: Secondary | ICD-10-CM | POA: Insufficient documentation

## 2021-12-18 DIAGNOSIS — Z3A32 32 weeks gestation of pregnancy: Secondary | ICD-10-CM | POA: Diagnosis not present

## 2021-12-18 DIAGNOSIS — Z3493 Encounter for supervision of normal pregnancy, unspecified, third trimester: Secondary | ICD-10-CM

## 2021-12-26 ENCOUNTER — Ambulatory Visit (INDEPENDENT_AMBULATORY_CARE_PROVIDER_SITE_OTHER): Payer: Medicaid Other | Admitting: Certified Nurse Midwife

## 2021-12-26 VITALS — BP 105/58 | HR 70 | Wt 202.0 lb

## 2021-12-26 DIAGNOSIS — Z3493 Encounter for supervision of normal pregnancy, unspecified, third trimester: Secondary | ICD-10-CM

## 2021-12-26 DIAGNOSIS — B009 Herpesviral infection, unspecified: Secondary | ICD-10-CM

## 2021-12-26 DIAGNOSIS — Z3A33 33 weeks gestation of pregnancy: Secondary | ICD-10-CM

## 2021-12-26 NOTE — Progress Notes (Signed)
PT presents for ROB 33/6. Pt has concerns that baby is possibly breech. No further concerns voiced by patient.  ?

## 2021-12-27 ENCOUNTER — Encounter: Payer: Self-pay | Admitting: Radiology

## 2021-12-29 NOTE — Progress Notes (Signed)
? ?  PRENATAL VISIT NOTE ? ?Subjective:  ?Lauren Jensen is a 31 y.o. G3P1011 at [redacted]w[redacted]d being seen today for ongoing prenatal care.  She is currently monitored for the following issues for this low-risk pregnancy and has GAD (generalized anxiety disorder); Seasonal allergies; HSV-2 infection; Supervision of low-risk pregnancy; Rh negative state in antepartum period; and Marginal placenta on their problem list. ? ?Patient reports  concern that baby is breech .  Contractions: Not present. Vag. Bleeding: None.  Movement: Present. Denies leaking of fluid.  ? ?The following portions of the patient's history were reviewed and updated as appropriate: allergies, current medications, past family history, past medical history, past social history, past surgical history and problem list.  ? ?Objective:  ? ?Vitals:  ? 12/26/21 0923  ?BP: (!) 105/58  ?Pulse: 70  ?Weight: 202 lb (91.6 kg)  ? ?Fetal Status: Fetal Heart Rate (bpm): 133 Fundal Height: 33 cm Movement: Present  Presentation: Vertex ? ?General:  Alert, oriented and cooperative. Patient is in no acute distress.  ?Skin: Skin is warm and dry. No rash noted.   ?Cardiovascular: Normal heart rate noted  ?Respiratory: Normal respiratory effort, no problems with respiration noted  ?Abdomen: Soft, gravid, appropriate for gestational age.  Pain/Pressure: Present     ?Pelvic: Cervical exam deferred        ?Extremities: Normal range of motion.     ?Mental Status: Normal mood and affect. Normal behavior. Normal judgment and thought content.  ? ?Assessment and Plan:  ?Pregnancy: G3P1011 at [redacted]w[redacted]d ?1. Supervision of low-risk pregnancy, third trimester ?- Doing well, feeling regular and vigorous fetal movement  ? ?2. [redacted] weeks gestation of pregnancy ?- Routine OB care  ? ?3. HSV-2 infection ?- Cannot see lesions but feels discomfort, will begin taking suppressive Valtrex now and will let us know if lesions present. Will need vaginal exam at admission for labor. ? ?Preterm labor symptoms and  general obstetric precautions including but not limited to vaginal bleeding, contractions, leaking of fluid and fetal movement were reviewed in detail with the patient. ?Please refer to After Visit Summary for other counseling recommendations.  ? ?Return in about 2 weeks (around 01/09/2022) for IN-PERSON, LOB. ? ?Future Appointments  ?Date Time Provider Department Center  ?01/07/2022  9:35 AM Bernerd Limbo, CNM [facility]  ?01/14/2022  8:35 AM Bernerd Limbo, CNM [facility]  ?01/16/2022  8:30 AM [facility] NURSE [facility]  ?01/16/2022  8:45 AM [facility] US5 [facility]  ? ? ?Bernerd Limbo, CNM ? ?

## 2022-01-07 ENCOUNTER — Ambulatory Visit (INDEPENDENT_AMBULATORY_CARE_PROVIDER_SITE_OTHER): Payer: Medicaid Other | Admitting: Certified Nurse Midwife

## 2022-01-07 ENCOUNTER — Ambulatory Visit (HOSPITAL_COMMUNITY)
Admission: RE | Admit: 2022-01-07 | Discharge: 2022-01-07 | Disposition: A | Discharge status: Home / Self Care | Payer: Medicaid Other | Source: Ambulatory Visit | Attending: Certified Nurse Midwife | Admitting: Certified Nurse Midwife

## 2022-01-07 ENCOUNTER — Other Ambulatory Visit: Payer: Self-pay | Admitting: Certified Nurse Midwife

## 2022-01-07 ENCOUNTER — Other Ambulatory Visit (HOSPITAL_COMMUNITY)
Admission: RE | Admit: 2022-01-07 | Discharge: 2022-01-07 | Disposition: A | Discharge status: Home / Self Care | Payer: Medicaid Other | Source: Ambulatory Visit | Attending: Certified Nurse Midwife | Admitting: Certified Nurse Midwife

## 2022-01-07 VITALS — BP 114/63 | HR 87 | Wt 201.0 lb

## 2022-01-07 DIAGNOSIS — R0789 Other chest pain: Secondary | ICD-10-CM | POA: Insufficient documentation

## 2022-01-07 DIAGNOSIS — R058 Other specified cough: Secondary | ICD-10-CM | POA: Diagnosis present

## 2022-01-07 DIAGNOSIS — O26899 Other specified pregnancy related conditions, unspecified trimester: Secondary | ICD-10-CM

## 2022-01-07 DIAGNOSIS — Z3493 Encounter for supervision of normal pregnancy, unspecified, third trimester: Secondary | ICD-10-CM

## 2022-01-07 DIAGNOSIS — Z6791 Unspecified blood type, Rh negative: Secondary | ICD-10-CM

## 2022-01-07 DIAGNOSIS — N898 Other specified noninflammatory disorders of vagina: Secondary | ICD-10-CM

## 2022-01-07 DIAGNOSIS — Z3A35 35 weeks gestation of pregnancy: Secondary | ICD-10-CM

## 2022-01-07 DIAGNOSIS — F411 Generalized anxiety disorder: Secondary | ICD-10-CM

## 2022-01-07 DIAGNOSIS — N949 Unspecified condition associated with female genital organs and menstrual cycle: Secondary | ICD-10-CM | POA: Diagnosis present

## 2022-01-07 DIAGNOSIS — B009 Herpesviral infection, unspecified: Secondary | ICD-10-CM

## 2022-01-07 NOTE — Progress Notes (Signed)
? ?PRENATAL VISIT NOTE ? ?Subjective:  ?Lauren Jensen is a 31 y.o. G3P1011 at [redacted]w[redacted]d being seen today for ongoing prenatal care.  She is currently monitored for the following issues for this low-risk pregnancy and has GAD (generalized anxiety disorder); Seasonal allergies; HSV-2 infection; Supervision of low-risk pregnancy; Rh negative state in antepartum period; and Marginal placenta on their problem list. ? ?Patient reports  10 days worth of cough, had some nasal congestion to start but now it is more of a chesty cough that is mostly dry, occasionally productive and causing some mid-sternal pain when she coughs. Tested negative for Covid, wonders if this has turned into a bacterial infection. Feels like she's bruised a rib coughing, she has some tenderness over her lower ribs on the right side (hurts to cough and when pressed). Also reports some burning/tingling in her vulva that she is concerned is an HSV outbreak starting, but has been taking her valtrex. Has not been able to identify a lesion, just has the discomfort .  Contractions: Not present. Vag. Bleeding: None.  Movement: Present. Denies leaking of fluid.  ? ?The following portions of the patient's history were reviewed and updated as appropriate: allergies, current medications, past family history, past medical history, past social history, past surgical history and problem list.  ? ?Objective:  ? ?Vitals:  ? 01/07/22 0948  ?BP: 114/63  ?Pulse: 87  ?Weight: 201 lb (91.2 kg)  ? ?Fetal Status: Fetal Heart Rate (bpm): 138 Fundal Height: 35 cm Movement: Present  Presentation: Vertex ? ?General:  Alert, oriented and cooperative. Patient is in no acute distress.  ?Skin: Skin is warm and dry. No rash noted.   ?Cardiovascular: Normal heart rate noted  ?Respiratory: Normal respiratory effort, no problems with respiration noted  ?Abdomen: Soft, gravid, appropriate for gestational age.  Pain/Pressure: Present     ?Pelvic: Pelvic exam performed with chaperone; no  areas of irritation/redness/skin breakdown noted but there is copious thick white discharge coating the vulva         ?Extremities: Normal range of motion.  Edema: Trace  ?Mental Status: Normal mood and affect. Normal behavior. Normal judgment and thought content.  ? ?Assessment and Plan:  ?Pregnancy: G3P1011 at [redacted]w[redacted]d ?1. Supervision of low-risk pregnancy, third trimester ?- Doing well overall, feeling plenty of fetal movement ? ?2. [redacted] weeks gestation of pregnancy ?- Routine OB care including anticipatory guidance re GBS swab at next appt. ? ?3. Rh negative state in antepartum period ?- S/p rhogam ? ?4. HSV-2 infection ?- Stable on valtrex ? ?5. GAD (generalized anxiety disorder) ?- Stable without meds ? ?6. Chesty cough ?- CXR ordered, pt to go today. Will send in tessalon perles for cough suppression if x-ray clear. ? ?7. Other chest pain ?- CXR ordered ? ?8. Vaginal discharge ?- Cervicovaginal ancillary only( [place]) ? ?9. Vaginal discomfort ?- Cervicovaginal ancillary only( [place]) ? ?Preterm labor symptoms and general obstetric precautions including but not limited to vaginal bleeding, contractions, leaking of fluid and fetal movement were reviewed in detail with the patient. ?Please refer to After Visit Summary for other counseling recommendations.  ? ?No follow-ups on file. ? ?Future Appointments  ?Date Time Provider Department Center  ?01/14/2022  8:35 AM Bernerd Limbo, CNM [facility]  ?01/16/2022  8:30 AM [facility] NURSE [facility]  ?01/16/2022  8:45 AM [facility] US5 [facility]  ?01/22/2022 10:35 AM [place] Bing, MD [facility]  ?01/29/2022 10:15 AM Allayne Stack, DO [facility]  ?02/04/2022 10:35 AM  [place] [facility]  ?02/11/2022 11:15 AM Bernerd Limbo, CNM [facility]  ?02/18/2022  1:15 PM [facility] NST [facility]  ?02/18/2022  2:15 PM Bernerd Limbo, CNM [facility]  ? ? ?Bernerd Limbo, CNM ?

## 2022-01-08 LAB — CERVICOVAGINAL ANCILLARY ONLY
Bacterial Vaginitis (gardnerella): NEGATIVE
Candida Glabrata: NEGATIVE
Candida Vaginitis: POSITIVE — AB
Comment: NEGATIVE
Comment: NEGATIVE
Comment: NEGATIVE

## 2022-01-09 MED ORDER — TERCONAZOLE 0.4 % VA CREA: 1.0000 | TOPICAL_CREAM | Freq: Every day | VAGINAL | 0 refills | Status: DC

## 2022-01-09 MED ORDER — BENZONATATE 100 MG PO CAPS: 200.0000 mg | ORAL_CAPSULE | Freq: Three times a day (TID) | ORAL | 0 refills | Status: DC | PRN

## 2022-01-09 NOTE — Addendum Note (Signed)
Addended by: Edd Arbour on: 01/09/2022 06:50 PM ? ? Modules accepted: Orders ? ?

## 2022-01-09 NOTE — Addendum Note (Signed)
Addended by: Edd Arbour on: 01/09/2022 07:50 PM ? ? Modules accepted: Orders ? ?

## 2022-01-14 ENCOUNTER — Other Ambulatory Visit (HOSPITAL_COMMUNITY)
Admission: RE | Admit: 2022-01-14 | Discharge: 2022-01-14 | Disposition: A | Discharge status: Home / Self Care | Payer: Medicaid Other | Source: Ambulatory Visit | Attending: Certified Nurse Midwife | Admitting: Certified Nurse Midwife

## 2022-01-14 ENCOUNTER — Ambulatory Visit (INDEPENDENT_AMBULATORY_CARE_PROVIDER_SITE_OTHER): Payer: Medicaid Other | Admitting: Certified Nurse Midwife

## 2022-01-14 VITALS — BP 124/70 | HR 86 | Wt 201.9 lb

## 2022-01-14 DIAGNOSIS — Z3A36 36 weeks gestation of pregnancy: Secondary | ICD-10-CM | POA: Diagnosis present

## 2022-01-14 DIAGNOSIS — Z3493 Encounter for supervision of normal pregnancy, unspecified, third trimester: Secondary | ICD-10-CM | POA: Diagnosis present

## 2022-01-14 DIAGNOSIS — R1011 Right upper quadrant pain: Secondary | ICD-10-CM

## 2022-01-14 DIAGNOSIS — O26899 Other specified pregnancy related conditions, unspecified trimester: Secondary | ICD-10-CM

## 2022-01-14 DIAGNOSIS — B009 Herpesviral infection, unspecified: Secondary | ICD-10-CM

## 2022-01-14 MED ORDER — MAGNESIUM OXIDE -MG SUPPLEMENT 200 MG PO TABS: 400.0000 mg | ORAL_TABLET | Freq: Every day | ORAL | 3 refills | Status: DC

## 2022-01-14 MED ORDER — CYCLOBENZAPRINE HCL 10 MG PO TABS
10.0000 mg | ORAL_TABLET | Freq: Three times a day (TID) | ORAL | 1 refills | Status: DC | PRN
Start: 2022-01-14 — End: 2022-02-08

## 2022-01-14 NOTE — Patient Instructions (Signed)
Lauren Jensen w/ [facility] [address] [place], [place] [address] [phone] [email or web address]  

## 2022-01-15 LAB — COMPREHENSIVE METABOLIC PANEL
ALT: 11 IU/L (ref 0–32)
AST: 14 IU/L (ref 0–40)
Albumin/Globulin Ratio: 1.6 (ref 1.2–2.2)
Albumin: 3.5 g/dL — ABNORMAL LOW (ref 3.9–5.0)
Alkaline Phosphatase: 106 IU/L (ref 44–121)
BUN/Creatinine Ratio: 11 (ref 9–23)
BUN: 5 mg/dL — ABNORMAL LOW (ref 6–20)
Bilirubin Total: 0.3 mg/dL (ref 0.0–1.2)
CO2: 17 mmol/L — ABNORMAL LOW (ref 20–29)
Calcium: 8.4 mg/dL — ABNORMAL LOW (ref 8.7–10.2)
Chloride: 103 mmol/L (ref 96–106)
Creatinine, Ser: 0.47 mg/dL — ABNORMAL LOW (ref 0.57–1.00)
Globulin, Total: 2.2 g/dL (ref 1.5–4.5)
Glucose: 79 mg/dL (ref 70–99)
Potassium: 3.8 mmol/L (ref 3.5–5.2)
Sodium: 138 mmol/L (ref 134–144)
Total Protein: 5.7 g/dL — ABNORMAL LOW (ref 6.0–8.5)
eGFR: 131 mL/min/{1.73_m2} (ref >59)

## 2022-01-15 LAB — CERVICOVAGINAL ANCILLARY ONLY
Chlamydia: NEGATIVE
Comment: NEGATIVE
Comment: NORMAL
Neisseria Gonorrhea: NEGATIVE

## 2022-01-16 ENCOUNTER — Ambulatory Visit: Payer: Medicaid Other | Admitting: *Deleted

## 2022-01-16 ENCOUNTER — Ambulatory Visit: Payer: Medicaid Other | Attending: Obstetrics and Gynecology

## 2022-01-16 ENCOUNTER — Encounter: Payer: Self-pay | Admitting: *Deleted

## 2022-01-16 VITALS — BP 113/51 | HR 75

## 2022-01-16 DIAGNOSIS — Z3689 Encounter for other specified antenatal screening: Secondary | ICD-10-CM | POA: Diagnosis present

## 2022-01-16 DIAGNOSIS — O99213 Obesity complicating pregnancy, third trimester: Secondary | ICD-10-CM

## 2022-01-16 DIAGNOSIS — Z3A36 36 weeks gestation of pregnancy: Secondary | ICD-10-CM | POA: Diagnosis not present

## 2022-01-16 DIAGNOSIS — Z362 Encounter for other antenatal screening follow-up: Secondary | ICD-10-CM

## 2022-01-16 DIAGNOSIS — O43193 Other malformation of placenta, third trimester: Secondary | ICD-10-CM

## 2022-01-16 DIAGNOSIS — E669 Obesity, unspecified: Secondary | ICD-10-CM | POA: Diagnosis not present

## 2022-01-17 NOTE — Progress Notes (Signed)
? ?  PRENATAL VISIT NOTE ? ?Subjective:  ?Lauren Jensen is a 31 y.o. G3P1011 at 77w0dbeing seen today for ongoing prenatal care.  She is currently monitored for the following issues for this low-risk pregnancy and has GAD (generalized anxiety disorder); Seasonal allergies; HSV-2 infection; Supervision of low-risk pregnancy; Rh negative state in antepartum period; and Marginal placenta on their problem list. ? ?Patient reports  improved cough but continued rib pain on the right side. Denies nausea/vomiting, pain is aggravated by movement .  Contractions: Not present. Vag. Bleeding: None.  Movement: Present. Denies leaking of fluid.  ? ?The following portions of the patient's history were reviewed and updated as appropriate: allergies, current medications, past family history, past medical history, past social history, past surgical history and problem list.  ? ?Objective:  ? ?Vitals:  ? 01/14/22 0857  ?BP: 124/70  ?Pulse: 86  ?Weight: 201 lb 14.4 oz (91.6 kg)  ? ?Fetal Status: Fetal Heart Rate (bpm): 130 Fundal Height: 36 cm Movement: Present  Presentation: Vertex ? ?General:  Alert, oriented and cooperative. Patient is in no acute distress.  ?Skin: Skin is warm and dry. No rash noted.   ?Cardiovascular: Normal heart rate noted  ?Respiratory: Normal respiratory effort, no problems with respiration noted  ?Abdomen: Soft, gravid, appropriate for gestational age.  Pain/Pressure: Present     ?Pelvic: Cervical exam deferred        ?Extremities: Normal range of motion.  Edema: Trace  ?Mental Status: Normal mood and affect. Normal behavior. Normal judgment and thought content.  ? ?Assessment and Plan:  ?Pregnancy: G3P1011 at 369w0d1. Supervision of low-risk pregnancy, third trimester ?- Doing well overall, feeling plenty of fetal movement ? ?2. [redacted] weeks gestation of pregnancy ?- Routine OB care  ?- Cervicovaginal ancillary only( [place]) ?- Strep Gp B Culture+Rflx ? ?3. Right upper quadrant abdominal pain affecting  pregnancy ?- CXR normal, no corresponding symptoms so unlikely to be gallbladder, pancreatitis or liver inflammation but will get CMP just in case.  ?- Likely musculoskeletal in nature - suggested Tylenol, magnesium and flexeril at night so her body is able to rest fully and gave referral info for local chiropractor. ?- Comp Met (CMET) ?- Magnesium Oxide (MAG-OXIDE) 200 MG TABS; Take 2 tablets (400 mg total) by mouth at bedtime. If that amount causes loose stools in the am, switch to 20059maily at bedtime. (Patient not taking: Reported on 01/16/2022)  Dispense: 60 tablet; Refill: 3 ?- cyclobenzaprine (FLEXERIL) 10 MG tablet; Take 1 tablet (10 mg total) by mouth every 8 (eight) hours as needed for muscle spasms. (Patient not taking: Reported on 01/16/2022)  Dispense: 30 tablet; Refill: 1 ? ?Preterm labor symptoms and general obstetric precautions including but not limited to vaginal bleeding, contractions, leaking of fluid and fetal movement were reviewed in detail with the patient. ?Please refer to After Visit Summary for other counseling recommendations.  ? ?Return in about 1 week (around 01/21/2022) for IN-PERSON, LOB. ? ?Future Appointments  ?Date Time Provider DepK[place]5/18/2023 10:35 AM PicAletha HalimD [facility]5/25/2023 10:15 AM BeaPatriciaann ClanO [facility]5/31/2023 10:35 AM WalGabriel CarinaNM [facility]6/03/2022 11:15 AM WalGabriel CarinaNM [facility]6/14/2023  1:15 PM W[facility]-WOCA NST [facility]  ?02/18/2022  2:15 PM WalGabriel CarinaNM [facility] ? ?JamGabriel CarinaNM ?

## 2022-01-19 LAB — STREP GP B CULTURE+RFLX: Strep Gp B Culture+Rflx: NEGATIVE

## 2022-01-22 ENCOUNTER — Ambulatory Visit (INDEPENDENT_AMBULATORY_CARE_PROVIDER_SITE_OTHER): Payer: Medicaid Other | Admitting: Obstetrics and Gynecology

## 2022-01-22 VITALS — BP 113/70 | HR 70 | Wt 202.2 lb

## 2022-01-22 DIAGNOSIS — B009 Herpesviral infection, unspecified: Secondary | ICD-10-CM

## 2022-01-22 DIAGNOSIS — O4420 Partial placenta previa NOS or without hemorrhage, unspecified trimester: Secondary | ICD-10-CM

## 2022-01-22 DIAGNOSIS — Z3A37 37 weeks gestation of pregnancy: Secondary | ICD-10-CM

## 2022-01-22 DIAGNOSIS — O26899 Other specified pregnancy related conditions, unspecified trimester: Secondary | ICD-10-CM

## 2022-01-22 DIAGNOSIS — Z6791 Unspecified blood type, Rh negative: Secondary | ICD-10-CM

## 2022-01-22 NOTE — Progress Notes (Signed)
   PRENATAL VISIT NOTE  Subjective:  Lauren Jensen is a 31 y.o. G3P1011 at [redacted]w[redacted]d being seen today for ongoing prenatal care.  She is currently monitored for the following issues for this low-risk pregnancy and has GAD (generalized anxiety disorder); Seasonal allergies; HSV-2 infection; Supervision of low-risk pregnancy; Rh negative state in antepartum period; and Marginal placenta on their problem list.  Patient reports no complaints.  Contractions: Irritability. Vag. Bleeding: None.  Movement: Present. Denies leaking of fluid.   The following portions of the patient's history were reviewed and updated as appropriate: allergies, current medications, past family history, past medical history, past social history, past surgical history and problem list.   Objective:   Vitals:   01/22/22 1036  BP: 113/70  Pulse: 70  Weight: 202 lb 3.2 oz (91.7 kg)    Fetal Status: Fetal Heart Rate (bpm): 145 Fundal Height: 37 cm Movement: Present  Presentation: Vertex  General:  Alert, oriented and cooperative. Patient is in no acute distress.  Skin: Skin is warm and dry. No rash noted.   Cardiovascular: Normal heart rate noted  Respiratory: Normal respiratory effort, no problems with respiration noted  Abdomen: Soft, gravid, appropriate for gestational age.  Pain/Pressure: Present     Pelvic: Cervical exam deferred        Extremities: Normal range of motion.  Edema: Trace  Mental Status: Normal mood and affect. Normal behavior. Normal judgment and thought content.   Assessment and Plan:  Pregnancy: G3P1011 at [redacted]w[redacted]d 1. [redacted] weeks gestation of pregnancy Offer membrane sweeping nv. Gbs neg  2. Marginal placenta Normal growth 5/12. Repeat PRN  3. Rh negative state in antepartum period Rhogam pp prn  4. HSV-2 infection Continue ppx valtrex  Term labor symptoms and general obstetric precautions including but not limited to vaginal bleeding, contractions, leaking of fluid and fetal movement were  reviewed in detail with the patient. Please refer to After Visit Summary for other counseling recommendations.   Return in about 10 days (around 02/01/2022) for low risk ob, in person.  Future Appointments  Date Time Provider Department Center  01/29/2022 10:15 AM Allayne Stack, DO [facility]  02/04/2022 10:35 AM Bernerd Limbo, CNM [facility]  02/11/2022 11:15 AM Bernerd Limbo, CNM [facility]  02/18/2022  1:15 PM [facility] NST [facility]  02/18/2022  2:15 PM Bernerd Limbo, CNM [facility]    [place] Bing, MD

## 2022-01-29 ENCOUNTER — Ambulatory Visit (INDEPENDENT_AMBULATORY_CARE_PROVIDER_SITE_OTHER): Payer: Medicaid Other | Admitting: Family Medicine

## 2022-01-29 VITALS — BP 116/82 | HR 76 | Wt 203.0 lb

## 2022-01-29 DIAGNOSIS — B009 Herpesviral infection, unspecified: Secondary | ICD-10-CM

## 2022-01-29 DIAGNOSIS — Z3A38 38 weeks gestation of pregnancy: Secondary | ICD-10-CM

## 2022-01-29 DIAGNOSIS — Z6791 Unspecified blood type, Rh negative: Secondary | ICD-10-CM

## 2022-01-29 DIAGNOSIS — O26899 Other specified pregnancy related conditions, unspecified trimester: Secondary | ICD-10-CM

## 2022-01-29 DIAGNOSIS — Z3490 Encounter for supervision of normal pregnancy, unspecified, unspecified trimester: Secondary | ICD-10-CM

## 2022-01-29 DIAGNOSIS — O4420 Partial placenta previa NOS or without hemorrhage, unspecified trimester: Secondary | ICD-10-CM

## 2022-01-29 NOTE — Progress Notes (Signed)
    Subjective:  Lauren Jensen is a 31 y.o. G3P1011 at [redacted]w[redacted]d being seen today for ongoing prenatal care.  She is currently monitored for the following issues for this low-risk pregnancy and has GAD (generalized anxiety disorder); Seasonal allergies; HSV-2 infection; Supervision of low-risk pregnancy; Rh negative state in antepartum period; and Marginal placenta on their problem list.  Patient reports no complaints. Has 4 shifts left in the hospital. Would like to hold off on cervical check today. Contractions: Irritability. Vag. Bleeding: None.  Movement: Present. Denies leaking of fluid.   The following portions of the patient's history were reviewed and updated as appropriate: allergies, current medications, past family history, past medical history, past social history, past surgical history and problem list.   Objective:   Vitals:   01/29/22 1038  BP: 116/82  Pulse: 76  Weight: 203 lb (92.1 kg)    Fetal Status: Fetal Heart Rate (bpm): 159 Fundal Height: 38 cm Movement: Present  Presentation: Vertex  General:  Alert, oriented and cooperative. Patient is in no acute distress.  Skin: Skin is warm and dry. No rash noted.   Cardiovascular: Normal heart rate noted  Respiratory: Normal respiratory effort, no problems with respiration noted  Abdomen: Soft, gravid, appropriate for gestational age. Pain/Pressure: Present     Pelvic:  Cervical exam deferred        Extremities: Normal range of motion.  Edema: Trace  Mental Status: Normal mood and affect. Normal behavior. Normal judgment and thought content.    Assessment and Plan:  Pregnancy: G3P1011 at [redacted]w[redacted]d  1. Encounter for supervision of low-risk pregnancy, antepartum Doing well with normal fetal movement. Discussed ways to promote spontaneous labor.   2. [redacted] weeks gestation of pregnancy Recommended trial of membrane sweep with cervical check next visit. Should get 41 week IOL scheduled next week, still planning for water birth.   3.  Marginal placenta 5/12 [place] with EFW 80%. No further MFM exams.   4. HSV-2 infection On prophylactic valtrex. No lesions.   5. Rh negative state in antepartum period RhoGAM eval postpartum.   Term labor symptoms and general obstetric precautions including but not limited to vaginal bleeding, contractions, leaking of fluid and fetal movement were reviewed in detail with the patient. Please refer to After Visit Summary for other counseling recommendations.   Follow up in 1 week (Already scheduled through the remainder of pregnancy with Lauren Jensen).    Allayne Stack, DO

## 2022-01-29 NOTE — Progress Notes (Signed)
Patient has elevated gad7 today- offered & discussed [facility] services

## 2022-01-29 NOTE — Patient Instructions (Signed)
These supplements and herbs are available over the counter without a prescription. They are often in the vitamin section of a pharmacy.   For pregnancy Blood Builder Magnesium - get Calm drink or gummies __________________________________________________________________________________________  To help ripen your Cervix/prepare the uterus (to get your cervix ready for labor):   Red Raspberry Leaf capsules:  two 300mg  or 400mg  tablets with each meal, 2-3 times a day  Potential Side Effects Of Raspberry Leaf:  Most women do not experience any side effects from drinking raspberry leaf tea. However, nausea and loose stools are possible. This can cause uterine irritability. If you notice having many braxton hicks contractions, stop this supplement.    Evening Primrose Oil capsules: may take 1 to 3 capsules daily. May also prick one to release the oil and insert it into your vagina at night.  One regimen would be to take 1 tablets three times per day and place 2 tablets in the vagina at night.   Some of the potential side effects:  Upset stomach  Loose stools or diarrhea  Headaches  Nausea   _____________________________________________________________________________________________  For Labor: All of these can be use in the last trimester of pregnancy  5-6 Dates a day -- This can help shorten your labor (may taste better if warmed in microwave until soft). This can also be combined with almond butter, any other nut butter, or wrap in bacon and bake, or toss in smoothies. Can also eat [place] bars. Found where raisins are in the grocery store   ______________________________________________________________________________________________  For Breastfeeding:   NOTHING WILL WORK UNLESS YOU ARE  - Emptying the breast adequately/completely - Emptying the breast regularly  Supplement/Herb Purpose Dose Side Effects  Vitamin D Increase Vitamin D to infant, recommended for all breastfeeding  moms and can use instead of supplementing infant 4000 IU daily None  Fenugreek** use with extreme caution as this can decrease milk supply in some  Increases prolactin  400mg  TID (max) Nausea, Loose stools and smelling like maple syrup  Goat's Rue Help increase differentiation of breast tissue, good for women with suspected IGT. Helps with insulin sensitvity 1 capsule BID Nausea, loose stools  Legendairy Milk Supplements -PumpPrincess -Liquid Gold -Cash Cow -Milkapalooza  Combination of herbal galactogues  Per packaging Per packaging  Lactation cookies/bars Food based galactogues/increase maternal calories All the time, q2-3 hours    Flax seeds Food based Galactogue Daily GI upset, nausea  Brewer's yeast Food based Galactogue Daily GI upset, nausea  Hemp Hearts Food based Galactogue Daily GI upset, nausea  Oats Food based Galactogue Daily   Lecithin (Soy or Sunflower) Decreases the viscosity of milk, galactogue, fat emulsifier  Good for oversupply mothers to prevent clogged ducts  GI upset  Rehydration drinks (Gatorade/Powerade) Improves maternal hydration and mammary glands are histologically similar to sweat glands  None, caution use in patients with T2DM

## 2022-02-04 ENCOUNTER — Ambulatory Visit (INDEPENDENT_AMBULATORY_CARE_PROVIDER_SITE_OTHER): Payer: Medicaid Other | Admitting: Certified Nurse Midwife

## 2022-02-04 ENCOUNTER — Other Ambulatory Visit: Payer: Self-pay | Admitting: Advanced Practice Midwife

## 2022-02-04 VITALS — BP 117/75 | HR 90 | Wt 204.8 lb

## 2022-02-04 DIAGNOSIS — Z3490 Encounter for supervision of normal pregnancy, unspecified, unspecified trimester: Secondary | ICD-10-CM

## 2022-02-04 DIAGNOSIS — Z3493 Encounter for supervision of normal pregnancy, unspecified, third trimester: Secondary | ICD-10-CM

## 2022-02-04 DIAGNOSIS — B009 Herpesviral infection, unspecified: Secondary | ICD-10-CM

## 2022-02-04 DIAGNOSIS — Z3A39 39 weeks gestation of pregnancy: Secondary | ICD-10-CM

## 2022-02-06 ENCOUNTER — Encounter (HOSPITAL_COMMUNITY): Payer: Self-pay | Admitting: Obstetrics & Gynecology

## 2022-02-06 ENCOUNTER — Inpatient Hospital Stay (HOSPITAL_COMMUNITY)
Admission: AD | Admit: 2022-02-06 | Discharge: 2022-02-08 | DRG: 807 | Disposition: A | Discharge status: Home / Self Care | Payer: Medicaid Other | Attending: Obstetrics and Gynecology | Admitting: Obstetrics and Gynecology

## 2022-02-06 ENCOUNTER — Inpatient Hospital Stay (HOSPITAL_COMMUNITY): Payer: Medicaid Other

## 2022-02-06 ENCOUNTER — Other Ambulatory Visit: Payer: Self-pay

## 2022-02-06 DIAGNOSIS — O9832 Other infections with a predominantly sexual mode of transmission complicating childbirth: Secondary | ICD-10-CM | POA: Diagnosis present

## 2022-02-06 DIAGNOSIS — Z3A39 39 weeks gestation of pregnancy: Secondary | ICD-10-CM

## 2022-02-06 DIAGNOSIS — O26893 Other specified pregnancy related conditions, third trimester: Principal | ICD-10-CM | POA: Diagnosis present

## 2022-02-06 DIAGNOSIS — Z6791 Unspecified blood type, Rh negative: Secondary | ICD-10-CM | POA: Diagnosis not present

## 2022-02-06 DIAGNOSIS — A6000 Herpesviral infection of urogenital system, unspecified: Secondary | ICD-10-CM | POA: Diagnosis present

## 2022-02-06 DIAGNOSIS — O4423 Partial placenta previa NOS or without hemorrhage, third trimester: Secondary | ICD-10-CM | POA: Diagnosis not present

## 2022-02-06 DIAGNOSIS — O26899 Other specified pregnancy related conditions, unspecified trimester: Secondary | ICD-10-CM

## 2022-02-06 DIAGNOSIS — Z3490 Encounter for supervision of normal pregnancy, unspecified, unspecified trimester: Secondary | ICD-10-CM | POA: Diagnosis present

## 2022-02-06 DIAGNOSIS — O4420 Partial placenta previa NOS or without hemorrhage, unspecified trimester: Secondary | ICD-10-CM | POA: Diagnosis present

## 2022-02-06 DIAGNOSIS — B009 Herpesviral infection, unspecified: Secondary | ICD-10-CM | POA: Diagnosis present

## 2022-02-06 LAB — CBC
HCT: 38.4 % (ref 36.0–46.0)
Hemoglobin: 13.5 g/dL (ref 12.0–15.0)
MCH: 32.6 pg (ref 26.0–34.0)
MCHC: 35.2 g/dL (ref 30.0–36.0)
MCV: 92.8 fL (ref 80.0–100.0)
Platelets: 226 10*3/uL (ref 150–400)
RBC: 4.14 MIL/uL (ref 3.87–5.11)
RDW: 14 % (ref 11.5–15.5)
WBC: 6.6 10*3/uL (ref 4.0–10.5)
nRBC: 0 % (ref 0.0–0.2)

## 2022-02-06 LAB — TYPE AND SCREEN
ABO/RH(D): A NEG
Antibody Screen: POSITIVE

## 2022-02-06 MED ORDER — DIBUCAINE (PERIANAL) 1 % EX OINT: 1.0000 "application " | TOPICAL_OINTMENT | CUTANEOUS | Status: DC | PRN

## 2022-02-06 MED ORDER — COCONUT OIL OIL: 1.0000 "application " | TOPICAL_OIL | Status: DC | PRN

## 2022-02-06 MED ORDER — IBUPROFEN 600 MG PO TABS
600.0000 mg | ORAL_TABLET | Freq: Four times a day (QID) | ORAL | Status: DC
  Administered 2022-02-06 – 2022-02-08 (×7): 600 mg via ORAL
  Filled 2022-02-06 (×7): qty 1

## 2022-02-06 MED ORDER — ACETAMINOPHEN 325 MG PO TABS: 650.0000 mg | ORAL_TABLET | ORAL | Status: DC | PRN

## 2022-02-06 MED ORDER — TERBUTALINE SULFATE 1 MG/ML IJ SOLN: 0.2500 mg | Freq: Once | INTRAMUSCULAR | Status: DC | PRN

## 2022-02-06 MED ORDER — MISOPROSTOL 50MCG HALF TABLET
50.0000 ug | ORAL_TABLET | ORAL | Status: DC | PRN
  Administered 2022-02-06: 50 ug via BUCCAL
  Filled 2022-02-06: qty 1

## 2022-02-06 MED ORDER — SIMETHICONE 80 MG PO CHEW: 80.0000 mg | CHEWABLE_TABLET | ORAL | Status: DC | PRN

## 2022-02-06 MED ORDER — LACTATED RINGERS IV SOLN: INTRAVENOUS | Status: DC

## 2022-02-06 MED ORDER — FENTANYL CITRATE (PF) 100 MCG/2ML IJ SOLN: 100.0000 ug | INTRAMUSCULAR | Status: DC | PRN

## 2022-02-06 MED ORDER — OXYCODONE-ACETAMINOPHEN 5-325 MG PO TABS: 1.0000 | ORAL_TABLET | ORAL | Status: DC | PRN

## 2022-02-06 MED ORDER — BENZOCAINE-MENTHOL 20-0.5 % EX AERO: 1.0000 "application " | INHALATION_SPRAY | CUTANEOUS | Status: DC | PRN

## 2022-02-06 MED ORDER — PRENATAL MULTIVITAMIN CH
1.0000 | ORAL_TABLET | Freq: Every day | ORAL | Status: DC
  Administered 2022-02-07 – 2022-02-08 (×2): 1 via ORAL
  Filled 2022-02-06 (×2): qty 1

## 2022-02-06 MED ORDER — DIPHENHYDRAMINE HCL 25 MG PO CAPS: 25.0000 mg | ORAL_CAPSULE | Freq: Four times a day (QID) | ORAL | Status: DC | PRN

## 2022-02-06 MED ORDER — ONDANSETRON HCL 4 MG PO TABS: 4.0000 mg | ORAL_TABLET | ORAL | Status: DC | PRN

## 2022-02-06 MED ORDER — SOD CITRATE-CITRIC ACID 500-334 MG/5ML PO SOLN: 30.0000 mL | ORAL | Status: DC | PRN

## 2022-02-06 MED ORDER — ONDANSETRON HCL 4 MG/2ML IJ SOLN: 4.0000 mg | Freq: Four times a day (QID) | INTRAMUSCULAR | Status: DC | PRN

## 2022-02-06 MED ORDER — WITCH HAZEL-GLYCERIN EX PADS: 1.0000 "application " | MEDICATED_PAD | CUTANEOUS | Status: DC | PRN

## 2022-02-06 MED ORDER — OXYTOCIN BOLUS FROM INFUSION
333.0000 mL | Freq: Once | INTRAVENOUS | Status: AC
  Administered 2022-02-06: 333 mL via INTRAVENOUS

## 2022-02-06 MED ORDER — OXYCODONE-ACETAMINOPHEN 5-325 MG PO TABS: 2.0000 | ORAL_TABLET | ORAL | Status: DC | PRN

## 2022-02-06 MED ORDER — ACETAMINOPHEN 325 MG PO TABS
650.0000 mg | ORAL_TABLET | ORAL | Status: DC | PRN
  Administered 2022-02-07 (×4): 650 mg via ORAL
  Filled 2022-02-06 (×4): qty 2

## 2022-02-06 MED ORDER — OXYTOCIN-SODIUM CHLORIDE 30-0.9 UT/500ML-% IV SOLN
2.5000 [IU]/h | INTRAVENOUS | Status: DC
  Filled 2022-02-06: qty 500

## 2022-02-06 MED ORDER — SENNOSIDES-DOCUSATE SODIUM 8.6-50 MG PO TABS
2.0000 | ORAL_TABLET | Freq: Every day | ORAL | Status: DC
  Administered 2022-02-07 – 2022-02-08 (×2): 2 via ORAL
  Filled 2022-02-06 (×2): qty 2

## 2022-02-06 MED ORDER — ZOLPIDEM TARTRATE 5 MG PO TABS: 5.0000 mg | ORAL_TABLET | Freq: Every evening | ORAL | Status: DC | PRN

## 2022-02-06 MED ORDER — ONDANSETRON HCL 4 MG/2ML IJ SOLN: 4.0000 mg | INTRAMUSCULAR | Status: DC | PRN

## 2022-02-06 MED ORDER — LIDOCAINE HCL (PF) 1 % IJ SOLN
30.0000 mL | INTRAMUSCULAR | Status: DC | PRN
  Filled 2022-02-06: qty 30

## 2022-02-06 MED ORDER — MISOPROSTOL 25 MCG QUARTER TABLET: 25.0000 ug | ORAL_TABLET | ORAL | Status: DC | PRN

## 2022-02-06 MED ORDER — TETANUS-DIPHTH-ACELL PERTUSSIS 5-2.5-18.5 LF-MCG/0.5 IM SUSY: 0.5000 mL | PREFILLED_SYRINGE | Freq: Once | INTRAMUSCULAR | Status: DC

## 2022-02-06 MED ORDER — LACTATED RINGERS IV SOLN: 500.0000 mL | INTRAVENOUS | Status: DC | PRN

## 2022-02-06 NOTE — Progress Notes (Signed)
Lauren Jensen is a 31 y.o. G3P1011 at [redacted]w[redacted]d by LMP admitted for induction of labor due to Elective at term.  Subjective: Patient reports more painful, frequent contractions. Requesting cervical exam.   Objective: BP 117/74   Pulse 95   Temp 98.6 F (37 C) (Oral)   Resp 17   Ht 5\' 4"  (1.626 m)   Wt 91.2 kg   LMP 05/03/2021   BMI 34.52 kg/m  No intake/output data recorded. No intake/output data recorded.  FHT: 135 bpm, moderate variability, +15x15 accels, no decels UC: Q 2-3 mins SVE:   Dilation: 5 Effacement (%): 70 Station: -2 Exam by:: [employer]  Labs: Lab Results  Component Value Date   WBC 6.6 02/06/2022   HGB 13.5 02/06/2022   HCT 38.4 02/06/2022   MCV 92.8 02/06/2022   PLT 226 02/06/2022    Assessment / Plan:   Labor: Progressing. Reviewed R/B/A of AROM. Patient desires AROM. AROM with moderate amount of clear fluid. Patient and fetus tolerated procedure well.  Fetal Wellbeing: IA with reassuring FHT's. Patient currently on monitor with Cat I tracing.  Pain Control:  desires water birth. Planning to get in tub soon I/D:  GBS neg Anticipated MOD:  NSVD   Renee Harder, CNM 02/06/2022, 6:13 PM

## 2022-02-06 NOTE — Progress Notes (Signed)
Post Partum Day 1 Subjective: no complaints. Breastfeeding well overnight. Patient was having paint throughout the night, rating 6/10, but has been able to manage well with PO oxycodone x1. Patient desires an early discharge if possible. Patient concerned for "gushes of blood" with movement.    Objective: Blood pressure 120/68, pulse 82, temperature 98.3 F (36.8 C), temperature source Oral, resp. rate 16, height 5\' 4"  (1.626 m), weight 91.2 kg, last menstrual period 05/03/2021, unknown if currently breastfeeding.  Physical Exam:  General: alert, cooperative, and appears stated age Lochia: appropriate Uterine Fundus: firm DVT Evaluation: No evidence of DVT seen on physical exam. No cords or calf tenderness. No significant calf/ankle edema.  Recent Labs    02/06/22 0926  HGB 13.5  HCT 38.4   CNM Circumcision Counseling Progress Note  Patient desires circumcision for her female infant.  Circumcision procedure details discussed, risks and benefits of procedure were also discussed.  These include but are not limited to: Benefits of circumcision in men include reduction in the rates of urinary tract infection (UTI), penile cancer, some sexually transmitted infections, penile inflammatory and retractile disorders, as well as easier hygiene.  Risks include bleeding , infection, injury of glans which may lead to penile deformity or urinary tract issues, unsatisfactory cosmetic appearance and other potential complications related to the procedure.  It was emphasized that this is an elective procedure.  Patient wants to proceed with circumcision; written informed consent will be obtained.  Will have MD do circumcision when infant is cleared for such by peds.  Assessment/Plan: Breastfeeding Frequent ambulation recommended to facilitate gut motility and decrease bleeding.  Awaiting circ for baby. Consent completed.  Continue with routine postpartum care.    LOS: 0 days   Jacquiline Doe, CNM   02/06/2022, 9:50 PM

## 2022-02-06 NOTE — Lactation Note (Signed)
This note was copied from a baby's chart. Lactation Consultation Note Assisted in latching baby to breast. Baby latched great. Mom denies pinching. Baby had wide flange. Mom in cross cradle position. Experienced BF mom has been stopped BF her 46 months old for 8 months. Left mom for bonding time. Will f/u on MBU.  Patient Name: Lauren Jensen POEUM'P Date: 02/06/2022 Reason for consult: L&D Initial assessment;Term Age:31 hours  Maternal Data Does the patient have breastfeeding experience prior to this delivery?: Yes How long did the patient breastfeed?: 14 months/stopped 8 months ago.  Feeding    LATCH Score Latch: Grasps breast easily, tongue down, lips flanged, rhythmical sucking.  Audible Swallowing: None  Type of Nipple: Everted at rest and after stimulation  Comfort (Breast/Nipple): Soft / non-tender  Hold (Positioning): Assistance needed to correctly position infant at breast and maintain latch.  LATCH Score: 7   Lactation Tools Discussed/Used    Interventions Interventions: Adjust position;Assisted with latch;Support pillows;Skin to skin;Breast compression  Discharge    Consult Status Consult Status: Follow-up from L&D Date: 02/07/22 Follow-up type: In-patient    Charyl Dancer 02/06/2022, 8:05 PM

## 2022-02-06 NOTE — Discharge Summary (Signed)
Postpartum Discharge Summary     Patient Name: Lauren Newgent DOB: 12/19/1990 MRN: [id number]  Date of admission: 02/06/2022 Delivery date:02/06/2022  Delivering provider: Renee Harder  Date of discharge: 02/08/2022  Admitting diagnosis: Encounter for elective induction of labor [Z34.90] Intrauterine pregnancy: [redacted]w[redacted]d    Secondary diagnosis:  Principal Problem:   Postpartum care following vaginal delivery Active Problems:   HSV-2 infection   Supervision of low-risk pregnancy   Rh negative state in antepartum period   Marginal placenta   Encounter for elective induction of labor   SVD (spontaneous vaginal delivery)  Additional problems: None     Discharge diagnosis: Term Pregnancy Delivered                                              Post partum procedures: None  Augmentation: AROM, Cytotec, and OP Foley Complications: None  Hospital course: Induction of Labor With Vaginal Delivery   31y.o. yo G3P1011 at 326w6das admitted to the hospital 02/06/2022 for induction of labor.  Indication for induction: Elective.  Patient had an uncomplicated labor course as follows: Membrane Rupture Time/Date: 6:08 PM ,02/06/2022   Delivery Method:Vaginal, Spontaneous  Episiotomy: None  Lacerations:  1st degree;Perineal;Periurethral  Details of delivery can be found in separate delivery note.  Patient had a routine postpartum course.  She is eating, drinking, voiding, and ambulating without issue.  Her pain and bleeding are controlled.  She is breastfeeding well.  Patient is discharged home 02/08/22.  Newborn Data: Birth date:02/06/2022  Birth time:7:27 PM  Gender:Female  Living status:Living  Apgars:9 ,9  Weight:3960 g   Magnesium Sulfate received: No BMZ received: No Rhophylac: Given postpartum  MMR: N/A - Immune  T-DaP: Offered postpartum  Flu: No Transfusion: No  Physical exam  Vitals:   02/07/22 1000 02/07/22 1450 02/07/22 2205 02/08/22 0558  BP: 117/76 121/72 118/70 113/71   Pulse: 74 68 70   Resp: 18  16 (!) 66  Temp: 98.2 F (36.8 C) 98 F (36.7 C) 98.8 F (37.1 C) 97.7 F (36.5 C)  TempSrc: Oral Axillary  Oral  SpO2: 99%     Weight:      Height:       General: alert, cooperative, and no distress Lochia: appropriate Uterine Fundus: firm and below umbilicus  DVT Evaluation: no LE edema or calf tenderness to palpation   Labs: Lab Results  Component Value Date   WBC 6.6 02/06/2022   HGB 13.5 02/06/2022   HCT 38.4 02/06/2022   MCV 92.8 02/06/2022   PLT 226 02/06/2022      Latest Ref Rng & Units 01/14/2022    9:35 AM  CMP  Glucose 70 - 99 mg/dL 79    BUN 6 - 20 mg/dL 5    Creatinine 0.57 - 1.00 mg/dL 0.47    Sodium 134 - 144 mmol/L 138    Potassium 3.5 - 5.2 mmol/L 3.8    Chloride 96 - 106 mmol/L 103    CO2 20 - 29 mmol/L 17    Calcium 8.7 - 10.2 mg/dL 8.4    Total Protein 6.0 - 8.5 g/dL 5.7    Total Bilirubin 0.0 - 1.2 mg/dL 0.3    Alkaline Phos 44 - 121 IU/L 106    AST 0 - 40 IU/L 14    ALT 0 - 32 IU/L 11  Edinburgh Score:    02/07/2022   10:00 AM  Edinburgh Postnatal Depression Scale Screening Tool  I have been able to laugh and see the funny side of things. 0  I have looked forward with enjoyment to things. 0  I have blamed myself unnecessarily when things went wrong. 1  I have been anxious or worried for no good reason. 2  I have felt scared or panicky for no good reason. 1  Things have been getting on top of me. 1  I have been so unhappy that I have had difficulty sleeping. 1  I have felt sad or miserable. 1  I have been so unhappy that I have been crying. 1  The thought of harming myself has occurred to me. 0  Edinburgh Postnatal Depression Scale Total 8     After visit meds:  Allergies as of 02/08/2022       Reactions   Penicillins Hives        Medication List     STOP taking these medications    cyclobenzaprine 10 MG tablet Commonly known as: FLEXERIL   Magnesium Oxide 200 MG Tabs Commonly known as:  Mag-Oxide       TAKE these medications    acetaminophen 500 MG tablet Commonly known as: TYLENOL Take 2 tablets (1,000 mg total) by mouth every 8 (eight) hours as needed (pain).   calcium carbonate 500 MG chewable tablet Commonly known as: TUMS - dosed in mg elemental calcium Chew 1 tablet by mouth daily.   ibuprofen 600 MG tablet Commonly known as: ADVIL Take 1 tablet (600 mg total) by mouth every 6 (six) hours as needed (pain).   loratadine 10 MG tablet Commonly known as: CLARITIN Take 10 mg by mouth daily.   prenatal vitamin w/FE, FA 27-1 MG Tabs tablet Take 1 tablet by mouth daily at 12 noon.   valACYclovir 1000 MG tablet Commonly known as: VALTREX Take 1 tablet (1,000 mg total) by mouth daily.        Discharge home in stable condition Infant Feeding: Breast Infant Disposition: home with mother Discharge instruction: per After Visit Summary and Postpartum booklet. Activity: Advance as tolerated. Pelvic rest for 6 weeks.  Diet: routine diet  Follow up Visit: Please schedule this patient for a In person postpartum visit in 6 weeks with the following provider: Any provider. Additional Postpartum F/U:  None   Low risk pregnancy complicated by:  None Delivery mode:  Vaginal, Spontaneous  Anticipated Birth Control:  OCPs, plans to start at postpartum visit   02/08/2022 Genia Del, MD

## 2022-02-06 NOTE — Progress Notes (Addendum)
Lauren Jensen is a 31 y.o. G3P1011 at [redacted]w[redacted]d by LMP admitted for induction of labor due to Elective at term.  Subjective: Patient is doing well. Reports contractions have spaced out now that her foley balloon is out.  Objective: BP 123/67   Pulse 88   Temp 98.3 F (36.8 C) (Oral)   Resp 18   Ht 5\' 4"  (1.626 m)   Wt 91.2 kg   LMP 05/03/2021   BMI 34.52 kg/m  No intake/output data recorded. No intake/output data recorded.  FHT:  FHR: 140 bpm, variability: moderate,  accelerations:  Present,  decelerations:  Absent UC:   q3 minutes SVE:   Dilation: 4.5 Effacement (%): 60 Station: -3 Exam by:: [email or web address]  Labs: Lab Results  Component Value Date   WBC 6.6 02/06/2022   HGB 13.5 02/06/2022   HCT 38.4 02/06/2022   MCV 92.8 02/06/2022   PLT 226 02/06/2022    Assessment / Plan: Augmentation of labor, progressing well  Labor:  Patient's foley balloon came out at 1330. Patient's contractions had been stronger and more regular but then spaced out so cytotec was started at 1440. On exam she was found to have a bulging bag, patient was agreeable to AROM when fetal station changes.  Fetal Wellbeing:  Category I now that on monitor. Has been doing intermittent ausculation with appropriate FHT's. If tracing remains reactive and reassuring, can come off per unit policy Pain Control:  Labor support without medications I/D:  GBS neg Anticipated MOD:  NSVD  04/08/2022 02/06/2022, 2:39 PM   Attestation of CNM Supervision of student: Evaluation and management procedures were performed by the student under my supervision. I was immediately available for direct supervision, assistance and direction throughout this encounter.  I also confirm that I have verified the information documented in the resident's note, and that I have also personally reperformed the pertinent components of the physical exam and all of the medical decision making activities.  I have also made any necessary editorial  changes.   Patient continues to desire water birth. Given that contractions have spaced out, will give dose of buccal cytotec. Fetal head still high and ballotable. Plan for AROM when appropriate. Patient is agreeable with this plan.   04/08/2022, CNM 02/06/2022 3:12 PM

## 2022-02-06 NOTE — H&P (Addendum)
OBSTETRIC ADMISSION HISTORY AND PHYSICAL  Lauren Jensen is a 31 y.o. female 943P1011 with IUP at 3844w6d by LMP presenting for IOL for water birth. She reports +FMs, No LOF, no VB, no blurry vision, headaches or peripheral edema, and RUQ pain.  She plans on breast feeding. She request pills for birth control. [facility]   Dating: By LMP --->  Estimated Date of Delivery: 02/07/22  Sono:    @[redacted]w[redacted]d , CWD, normal anatomy, Cephalic presentation, Anterior lie, 3317g, 80% EFW   Prenatal History/Complications: HSV-2 infection, Rh-, Marginal Placenta, Generalized Anxiety Disorder, Seasonal Allergies  Past Medical History: Past Medical History:  Diagnosis Date   Allergy    Anemia    Anxiety    Depression    Heart murmur    HSV-2 infection     Past Surgical History: Past Surgical History:  Procedure Laterality Date   NO PAST SURGERIES      Obstetrical History: OB History     Gravida  3   Para  1   Term  1   Preterm  0   AB  1   Living  1      SAB  0   IAB  1   Ectopic  0   Multiple  0   Live Births  1           Social History Social History   Socioeconomic History   Marital status: Single    Spouse name: Not on file   Number of children: Not on file   Years of education: Not on file   Highest education level: Not on file  Occupational History   Not on file  Tobacco Use   Smoking status: Never   Smokeless tobacco: Never  Vaping Use   Vaping Use: Never used  Substance and Sexual Activity   Alcohol use: Not Currently    Comment: not weekly   Drug use: Never   Sexual activity: Yes    Partners: Male  Other Topics Concern   Not on file  Social History Narrative   Not on file   Social Determinants of Health   Financial Resource Strain: Not on file  Food Insecurity: No Food Insecurity   Worried About Running Out of Food in the Last Year: Never true   Ran Out of Food in the Last Year: Never true  Transportation Needs: No  Transportation Needs   Lack of Transportation (Medical): No   Lack of Transportation (Non-Medical): No  Physical Activity: Not on file  Stress: Not on file  Social Connections: Not on file    Family History: Family History  Problem Relation Age of Onset   Asthma Mother    Allergies Mother    Mental illness Father    Diabetes Father    Atrial fibrillation Father    Mental illness Sister    Mental illness Brother    Breast cancer Maternal Grandmother    Allergies Maternal Grandfather    Colon cancer Paternal Grandfather     Allergies: Allergies  Allergen Reactions   Penicillins Hives    Medications Prior to Admission  Medication Sig Dispense Refill Last Dose   prenatal vitamin w/FE, FA (PRENATAL 1 + 1) 27-1 MG TABS tablet Take 1 tablet by mouth daily at 12 noon.   02/05/2022   valACYclovir (VALTREX) 1000 MG tablet Take 1 tablet (1,000 mg total) by mouth daily. 30 tablet 1 02/05/2022   calcium carbonate (TUMS - DOSED IN MG  ELEMENTAL CALCIUM) 500 MG chewable tablet Chew 1 tablet by mouth daily.      cyclobenzaprine (FLEXERIL) 10 MG tablet Take 1 tablet (10 mg total) by mouth every 8 (eight) hours as needed for muscle spasms. (Patient not taking: Reported on 02/04/2022) 30 tablet 1    loratadine (CLARITIN) 10 MG tablet Take 10 mg by mouth daily.      Magnesium Oxide (MAG-OXIDE) 200 MG TABS Take 2 tablets (400 mg total) by mouth at bedtime. If that amount causes loose stools in the am, switch to 200mg  daily at bedtime. 60 tablet 3      Review of Systems   All systems reviewed and negative except as stated in HPI  Blood pressure 129/69, pulse (!) 101, temperature 98.1 F (36.7 C), temperature source Oral, resp. rate 16, weight 91.2 kg, last menstrual period 05/03/2021, unknown if currently breastfeeding. General appearance: alert, cooperative, appears stated age, and no distress Lungs: clear to auscultation bilaterally Heart: regular rate and rhythm Abdomen: soft, non-tender;  bowel sounds normal Pelvic: Normal Extremities: Homans sign is negative, no sign of DVT DTR's intact Presentation: cephalic Fetal monitoringBaseline: 130 bpm, Variability: Good {> 6 bpm), Accelerations: Reactive, and Decelerations: Absent Uterine activity: Date/time of onset: 10:45AM  Dilation: 1 Effacement (%): 50 Station: -3 Exam by:: [email or web address] CNM   Prenatal labs: ABO, Rh: --/--/A NEG (06/02 [birth year]-1981) Antibody: POS (06/02 0926) Rubella: Immune (11/22 0000) RPR: Non Reactive (03/15 0842)  HBsAg: Negative (11/22 0000)  HIV: Non Reactive (03/15 0842)  GBS: Negative/-- (05/10 1109)  1 hr Glucola 171 (wnl) Genetic screening: Neg NIPS/AFP/[employer] 08-21-1982 Normal  Prenatal Transfer Tool  Maternal Diabetes: No Genetic Screening: Normal Maternal Ultrasounds/Referrals: Normal Fetal Ultrasounds or other Referrals:  Referred to Materal Fetal Medicine  for marginal cord insertion Maternal Substance Abuse:  No Significant Maternal Medications:  Meds include: Other:  Valtrex Significant Maternal Lab Results: Group B Strep negative and Rh negative   Results for orders placed or performed during the hospital encounter of 02/06/22 (from the past 24 hour(s))  CBC   Collection Time: 02/06/22  9:26 AM  Result Value Ref Range   WBC 6.6 4.0 - 10.5 K/uL   RBC 4.14 3.87 - 5.11 MIL/uL   Hemoglobin 13.5 12.0 - 15.0 g/dL   HCT 04/08/22 29.9 - 24.2 %   MCV 92.8 80.0 - 100.0 fL   MCH 32.6 26.0 - 34.0 pg   MCHC 35.2 30.0 - 36.0 g/dL   RDW 68.3 41.9 - 62.2 %   Platelets 226 150 - 400 K/uL   nRBC 0.0 0.0 - 0.2 %  Type and screen   Collection Time: 02/06/22  9:26 AM  Result Value Ref Range   ABO/RH(D) A NEG    Antibody Screen POS    Sample Expiration      02/09/2022,2359 Performed at [facility], [address] [place], [place]     Patient Active Problem List   Diagnosis Date Noted   Encounter for elective induction of labor 02/06/2022   Marginal placenta 09/21/2021    Supervision of low-risk pregnancy 09/17/2021   Rh negative state in antepartum period 09/17/2021   HSV-2 infection 08/20/2021   GAD (generalized anxiety disorder) 08/16/2018   Seasonal allergies 08/16/2018    Assessment/Plan:  Lauren Jensen is a 31 y.o. G3P1011 at [redacted]w[redacted]d here for IOL for water birth.   #Labor: Desires water birth. Patient contracting Q2-3 mins with bloody show. Discussed foley balloon and cytotec. Patient desires to  hold off on cytotec and have foley balloon placed for now. Foley balloon placed by Camelia Eng, CNM without difficulty. Patient and fetus tolerated procedure well. She is agreeable to starting cytotec if contractions space. AROM when appropriate. After 20 minutes of monitoring post foley balloon placement, patient may come off monitors and do intermittent auscultation per unit policy.  #Pain: PRN #FWB: Cat 1 #ID:  GBS neg #MOF: Breast #MOC: Birth control pills #Circ:  Yes  Charolett Bumpers, Medical Student  02/06/2022, 11:08 AM   Attestation of CNM Supervision of Student: Evaluation and management procedures were performed by the student under my supervision. I was immediately available for direct supervision, assistance and direction throughout this encounter.  I also confirm that I have verified the information documented in the resident's note, and that I have also personally reperformed the pertinent components of the physical exam and all of the medical decision making activities.  I have also made any necessary editorial changes.  Brand Males, CNM 02/06/2022 11:16 AM

## 2022-02-07 LAB — RPR: RPR Ser Ql: NONREACTIVE

## 2022-02-07 MED ORDER — RHO D IMMUNE GLOBULIN 1500 UNIT/2ML IJ SOSY
300.0000 ug | PREFILLED_SYRINGE | Freq: Once | INTRAMUSCULAR | Status: AC
  Administered 2022-02-07: 300 ug via INTRAVENOUS
  Filled 2022-02-07: qty 2

## 2022-02-07 MED ORDER — OXYCODONE HCL 5 MG PO TABS
5.0000 mg | ORAL_TABLET | ORAL | Status: DC | PRN
  Administered 2022-02-07 – 2022-02-08 (×3): 5 mg via ORAL
  Filled 2022-02-07 (×3): qty 1

## 2022-02-07 NOTE — Lactation Note (Signed)
This note was copied from a baby's chart. Lactation Consultation Note Attempted to see mom. Mom stated she just finished BF that the baby is wanting to BF a lot. Mom stated it is a little bit pinchy she thinks the baby has a recessed chin and when she attempts to do chin tug the baby doesn't want to open more. LC stated baby's can be a little tight and not want to open wide. Hopefully tomorrow he will relax more. Mom is experienced BF.  Encouraged mom to call for feeding assistance. Mom is cramping really bad. Encouraged to rest while baby is resting.  Patient Name: Lauren Jensen [id number] Date: 02/07/2022   Age:66 hours  Maternal Data    Feeding    LATCH Score                    Lactation Tools Discussed/Used    Interventions    Discharge    Consult Status      Charyl Dancer 02/07/2022, 3:04 AM

## 2022-02-07 NOTE — Lactation Note (Signed)
This note was copied from a baby's chart. Lactation Consultation Note  Patient Name: Lauren Jensen EGBTD'V Date: 02/07/2022 Reason for consult: Initial assessment;Term Age:30 hours  LC in to visit with P2 Mom of term baby.  Baby latched in semi-cross cradle hold.  LC elevated baby on another pillow as Mom was a bit hunched over.  Mom reports feeling some pinching at times, but in general latch is more comfortable.  Baby's mouth is gaped widely and sucking with deep jaw extensions and swallows identified.    Reviewed hand expression and colostrum flowing easily.  Encouraged STS with baby and offering the breast often with feeding cues.   Mom to call prn for concerns.   Maternal Data Has patient been taught Hand Expression?: Yes Does the patient have breastfeeding experience prior to this delivery?: Yes How long did the patient breastfeed?: 14 months with first baby  Feeding Mother's Current Feeding Choice: Breast Milk  LATCH Score Latch: Grasps breast easily, tongue down, lips flanged, rhythmical sucking.  Audible Swallowing: Spontaneous and intermittent  Type of Nipple: Everted at rest and after stimulation  Comfort (Breast/Nipple): Filling, red/small blisters or bruises, mild/mod discomfort (No visible trauma on nipples)  Hold (Positioning): Assistance needed to correctly position infant at breast and maintain latch.  LATCH Score: 8   Interventions Interventions: Breast feeding basics reviewed;Assisted with latch;Skin to skin;Breast massage;Hand express;Support pillows;Adjust position;LC Services brochure  Discharge Pump: Personal ([employer] DEBP)  Consult Status Consult Status: Follow-up Date: 02/08/22 Follow-up type: In-patient    Judee Clara 02/07/2022, 11:09 AM

## 2022-02-07 NOTE — Progress Notes (Signed)
MOB was referred for history of depression/anxiety. * Referral screened out by Clinical Social Worker because none of the following criteria appear to apply: ~ History of anxiety/depression during this pregnancy, or of post-partum depression following prior delivery. ~ Diagnosis of anxiety and/or depression within last 3 years; No concerns noted in OB records.  OR * MOB's symptoms currently being treated with medication and/or therapy.  Please contact the Clinical Social Worker if needs arise or by MOB request. MOB's Edinburgh Score is 8.   Jaiyana Canale Boyd-Gilyard, MSW, LCSW Clinical Social Work [phone] 

## 2022-02-07 NOTE — Lactation Note (Signed)
This note was copied from a baby's chart. Lactation Consultation Note  Patient Name: Lauren Jensen [id number] Date: 02/07/2022 Reason for consult: Follow-up assessment;Mother's request;Difficult latch;Term;Breastfeeding assistance Age:31 hours  LC assisted to help resolve pain with pinchy latch. Mom had compression stripe but otherwise skin intact. LC assisted getting more depth in cross cradle prone with increase in depth of swallows with breast compression. Infant still feeding at the end of the visit, Mom states pain "pinching" reduced with changes made.  All questions answered at the end of the visit.   Maternal Data    Feeding Mother's Current Feeding Choice: Breast Milk  LATCH Score Latch: Repeated attempts needed to sustain latch, nipple held in mouth throughout feeding, stimulation needed to elicit sucking reflex.  Audible Swallowing: Spontaneous and intermittent  Type of Nipple: Everted at rest and after stimulation  Comfort (Breast/Nipple): Filling, red/small blisters or bruises, mild/mod discomfort  Hold (Positioning): Assistance needed to correctly position infant at breast and maintain latch.  LATCH Score: 7   Lactation Tools Discussed/Used    Interventions Interventions: Breast feeding basics reviewed;Assisted with latch;Skin to skin;Breast massage;Hand express;Breast compression;Adjust position;Support pillows;Position options;Expressed milk;Education;Tour manager education  Discharge    Consult Status Consult Status: Follow-up Date: 02/08/22 Follow-up type: In-patient    Leanne Sisler  Nicholson-Springer 02/07/2022, 10:22 PM

## 2022-02-07 NOTE — Progress Notes (Signed)
RN called to CNM. Patient experiencing some "intense cramping" with breastfeeding despite Ibuprofen and Tylenol administration. Rates pain 6/10. CNM ordered oxycodone 5mg  PO q4hr PRN for moderate to severe pain.   Lauren Jensen ) Lauren Deis, MSN, CNM  Center for [facility]  02/07/22 12:23 AM

## 2022-02-08 MED ORDER — IBUPROFEN 600 MG PO TABS: 600.0000 mg | ORAL_TABLET | Freq: Four times a day (QID) | ORAL | 0 refills | Status: DC | PRN

## 2022-02-08 MED ORDER — ACETAMINOPHEN 500 MG PO TABS: 1000.0000 mg | ORAL_TABLET | Freq: Three times a day (TID) | ORAL | 0 refills | Status: DC | PRN

## 2022-02-08 NOTE — Lactation Note (Signed)
This note was copied from a baby's chart. Lactation Consultation Note  Patient Name: Boy Maxx Calaway [id number] Date: 02/08/2022 Reason for consult: Follow-up assessment;Term Age:31 hours   P2 mother whose infant is now 42 hours old.  This is a term baby at 39+6 weeks.  Mother's current feeding preference is breast/donor breast milk.  Mother breast fed her first child for 14 months.  Mother requested latch assistance.  RN had assisted mother to latch prior to my arrival; baby sleepy; circumcision this morning.  Offered to assist with waking "Ree Kida" and mother receptive.  Small compression stripe on mother's nipple when removing "Ree Kida" from the breast.  Noted him to possibly have a short thin posterior lingual frenulum.  He does not readily thrust tongue over the lower alveolar ridge.    Awakened and latched deeply.  Mother hand expressed 5 mls which she spoon fed prior to latching.  Allowed some suck training to help "Ree Kida" relax and initiate a good suck on my gloved finger.  Assisted to latch in the football hold and observed him feeding for 15 minutes on/off with a few repeated attempts.  He remained sleepy.  Removed him from the breast; no stripe noted at this time.  Burped well and latched again.  After another 5 minutes he was too sleepy to continue.  Removed him from the breast and noted a small compression stripe.  Encouraged coconut oil for comfort.  Mother has her own balm and hydrogel pads at bedside also.  Discussed using the #24 nipple shield if needed at home and reviewed nipple shield placement.  She may also pump as desired and feed back any expressed milk she obtains to "Ree Kida."  Suggested mother continue to work on obtaining a deep latch with every feeding.  I feel like she has made progress; reassurance given.  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  She has a Spectra pump for home use and good family support.  Father present.   RN updated.   Maternal  Data Has patient been taught Hand Expression?: Yes Does the patient have breastfeeding experience prior to this delivery?: Yes How long did the patient breastfeed?: 14 months  Feeding Mother's Current Feeding Choice: Breast Milk and Donor Milk  LATCH Score Latch: Repeated attempts needed to sustain latch, nipple held in mouth throughout feeding, stimulation needed to elicit sucking reflex.  Audible Swallowing: Spontaneous and intermittent  Type of Nipple: Everted at rest and after stimulation (Repeated attempts; compression stripe at times; mother learning to latch deeply)  Comfort (Breast/Nipple): Filling, red/small blisters or bruises, mild/mod discomfort  Hold (Positioning): Assistance needed to correctly position infant at breast and maintain latch.  LATCH Score: 7   Lactation Tools Discussed/Used Tools: Pump;Flanges;Coconut oil;Comfort gels;Nipple Shields Nipple shield size: 24  Interventions Interventions: Breast feeding basics reviewed;Assisted with latch;Skin to skin;Breast massage;Hand express;Breast compression;Coconut oil;Expressed milk;Position options;Support pillows;Adjust position;Education  Discharge Discharge Education: Engorgement and breast care Pump: Personal (Spectra) WIC Program: No  Consult Status Consult Status: Complete Date: 02/08/22 Follow-up type: Call as needed    Agastya Meister R Boy Delamater 02/08/2022, 12:18 PM

## 2022-02-08 NOTE — Progress Notes (Signed)
   PRENATAL VISIT NOTE  Subjective:  Lauren Jensen is a 31 y.o. [id number] at [redacted]w[redacted]d being seen today for ongoing prenatal care.  She is currently monitored for the following issues for this low-risk pregnancy and has GAD (generalized anxiety disorder); Seasonal allergies; HSV-2 infection; Supervision of low-risk pregnancy; Rh negative state in antepartum period; Marginal placenta; Encounter for elective induction of labor; SVD (spontaneous vaginal delivery); and Postpartum care following vaginal delivery on their problem list.  Patient reports no complaints.  Contractions: Irritability. Vag. Bleeding: None.  Movement: Present. Denies leaking of fluid.   The following portions of the patient's history were reviewed and updated as appropriate: allergies, current medications, past family history, past medical history, past social history, past surgical history and problem list.   Objective:   Vitals:   02/04/22 1048  BP: 117/75  Pulse: 90  Weight: 204 lb 12.8 oz (92.9 kg)    Fetal Status:     Movement: Present     General:  Alert, oriented and cooperative. Patient is in no acute distress.  Skin: Skin is warm and dry. No rash noted.   Cardiovascular: Normal heart rate noted  Respiratory: Normal respiratory effort, no problems with respiration noted  Abdomen: Soft, gravid, appropriate for gestational age.  Pain/Pressure: Present     Pelvic: Cervical exam deferred        Extremities: Normal range of motion.  Edema: Trace  Mental Status: Normal mood and affect. Normal behavior. Normal judgment and thought content.   Assessment and Plan:  Pregnancy: [id number] at [redacted]w[redacted]d 1. Encounter for supervision of low-risk pregnancy in third trimester - Doing well, feeling regular and vigorous fetal movement   2. [redacted] weeks gestation of pregnancy - Routine OB care   3. HSV-2 infection - On Valtrex for suppression  Term labor symptoms and general obstetric precautions including but not limited to vaginal  bleeding, contractions, leaking of fluid and fetal movement were reviewed in detail with the patient. Please refer to After Visit Summary for other counseling recommendations.   Return in about 1 week (around 02/11/2022) for IN-PERSON, LOB.  No future appointments.  Bernerd Limbo, CNM

## 2022-02-11 ENCOUNTER — Encounter: Payer: Medicaid Other | Admitting: Certified Nurse Midwife

## 2022-02-13 LAB — RH IG WORKUP (INCLUDES ABO/RH)
Fetal Screen: NEGATIVE
Gestational Age(Wks): 39.6
Unit division: 0

## 2022-02-17 ENCOUNTER — Telehealth (HOSPITAL_COMMUNITY): Payer: Self-pay

## 2022-02-17 ENCOUNTER — Ambulatory Visit: Payer: Self-pay

## 2022-02-17 NOTE — Telephone Encounter (Signed)
No answer. Left message to return nurse call.  Marcelino Duster [facility] 06/13//2023,0909

## 2022-02-17 NOTE — Lactation Note (Signed)
This note was copied from a baby's chart. LC talked to RN, Journi Canipe looking for latch assistance with her 43 day old baby. Mom had some nipple trauma 4 days ago with bleeding and scabs but healed with use of her EBM.  Mom has not had a follow up appt for infant as yet to see if he made his birthweight.  Mom feeding by cues 8-12x 24hr period. Mom also suspended pumping due to pain.  Infant producing as output 8-10 x day urine and stool brownish yellow seedy in color.  Mom feels latch is better with healing of nipples still like to be seen to be sure.   Paxton talked about getting more depth with cross cradle prone latch, that we did while she was in the hospital.    [facility] sent email to get outpatient appt and number provided for her to call.

## 2022-02-18 ENCOUNTER — Other Ambulatory Visit: Payer: Medicaid Other

## 2022-02-18 ENCOUNTER — Encounter: Payer: Medicaid Other | Admitting: Certified Nurse Midwife

## 2022-02-19 ENCOUNTER — Telehealth: Payer: Self-pay | Admitting: Lactation Services

## 2022-02-19 MED ORDER — VALACYCLOVIR HCL 500 MG PO TABS: 500.0000 mg | ORAL_TABLET | Freq: Two times a day (BID) | ORAL | 99 refills | Status: DC

## 2022-02-19 NOTE — Telephone Encounter (Signed)
Received refill request for Valcyclovir. Patient was taking for suppression in late pregnancy.   Called patient and she reports she does not have an outbreak currently. Reviewed with patient that I can send in refills for outbreaks to be taken as needed for outbreaks. Patient voiced understanding.

## 2022-02-20 ENCOUNTER — Encounter: Payer: Self-pay | Admitting: Radiology

## 2022-03-11 ENCOUNTER — Ambulatory Visit (INDEPENDENT_AMBULATORY_CARE_PROVIDER_SITE_OTHER): Payer: Medicaid Other | Admitting: Certified Nurse Midwife

## 2022-03-11 ENCOUNTER — Other Ambulatory Visit: Payer: Self-pay

## 2022-03-11 ENCOUNTER — Encounter: Payer: Self-pay | Admitting: Certified Nurse Midwife

## 2022-03-11 DIAGNOSIS — Z3009 Encounter for other general counseling and advice on contraception: Secondary | ICD-10-CM | POA: Diagnosis not present

## 2022-03-11 MED ORDER — SLYND 4 MG PO TABS: 1.0000 | ORAL_TABLET | Freq: Every day | ORAL | 11 refills | Status: DC

## 2022-03-11 NOTE — Progress Notes (Signed)
Postpartum Visit Note  Lauren Jensen is a 31 y.o. G37P2012 female who presents for a postpartum visit. She is 4 weeks postpartum following a normal spontaneous vaginal delivery.  I have fully reviewed the prenatal and intrapartum course. The delivery was at 39/6 gestational weeks.  Anesthesia: none. Postpartum course has been uncomplicated for her, baby has several challenges with breastfeeding (see lactation note). Baby is doing well. Baby is feeding by breast. Bleeding thin lochia. Bowel function is normal. Bladder function is normal. Patient is not sexually active. Contraception method is none. Postpartum depression screening: negative.   Upstream - 03/11/22 2226       Pregnancy Intention Screening   Does the patient want to become pregnant in the next year? No    Does the patient's partner want to become pregnant in the next year? No    Would the patient like to discuss contraceptive options today? Yes      Contraception Wrap Up   Current Method Abstinence    End Method Oral Contraceptive    Contraception Counseling Provided Yes            The pregnancy intention screening data noted above was reviewed. Potential methods of contraception were discussed. The patient elected to proceed with Oral Contraceptive.   Edinburgh Postnatal Depression Scale - 03/11/22 1648       Edinburgh Postnatal Depression Scale:  In the Past 7 Days   I have been able to laugh and see the funny side of things. 0    I have looked forward with enjoyment to things. 0    I have blamed myself unnecessarily when things went wrong. 0    I have been anxious or worried for no good reason. 1    I have felt scared or panicky for no good reason. 0    Things have been getting on top of me. 2    I have been so unhappy that I have had difficulty sleeping. 0    I have felt sad or miserable. 0    I have been so unhappy that I have been crying. 0    The thought of harming myself has occurred to me. 0    Edinburgh  Postnatal Depression Scale Total 3            Health Maintenance Due  Topic Date Due   COVID-19 Vaccine (1) Never done   Hepatitis C Screening  Never done    The following portions of the patient's history were reviewed and updated as appropriate: allergies, current medications, past family history, past medical history, past social history, past surgical history, and problem list.  Review of Systems Pertinent items noted in HPI and remainder of comprehensive ROS otherwise negative.  Objective:  BP 119/61   Pulse 81   Ht 5\' 6"  (1.676 m)   Wt 191 lb 3.2 oz (86.7 kg)   Breastfeeding Yes   BMI 30.86 kg/m    Constitutional: Alert, oriented female in no physical distress.  HEENT: PERRLA Skin: normal color and turgor, no rash Cardiovascular: normal rate & rhythm Respiratory: normal effort, no problems with respiration noted GI: Abd soft, non-tender MS: Extremities nontender, no edema, normal ROM Neurologic: Alert and oriented x 4.  GU: no CVA tenderness Pelvic: exam deferred Breasts: normal lactating breasts, no edema or signs of nipple damage Incision: well-approximated without exudate, redness or edema.  Assessment:  1. Postpartum care and examination - Doing well overall  2. Birth control counseling - Requested  POPs, sent to pharmacy  Plan:   Essential components of care per ACOG recommendations:  1.  Mood and well being: Patient with negative depression screening today. Reviewed local resources for support.  - Patient tobacco use? No.   - hx of drug use? No.    2. Infant care and feeding:  -Patient currently breastmilk feeding? Yes. Reviewed importance of draining breast regularly to support lactation.  -Social determinants of health (SDOH) reviewed in EPIC. No concerns  3. Sexuality, contraception and birth spacing - Patient does not want a pregnancy in the next year.  Desired family size is 2 children.  - Reviewed reproductive life planning. Reviewed  contraceptive methods based on pt preferences and effectiveness.  Patient desired Oral Contraceptive today.   - Discussed birth spacing of 18 months  4. Sleep and fatigue -Encouraged family/partner/community support of 4 hrs of uninterrupted sleep to help with mood and fatigue  5. Physical Recovery  - Discussed patients delivery and complications. She describes her labor as good. - Patient had a Vaginal, no problems at delivery. Patient had a 1st degree laceration. Perineal healing reviewed. Patient expressed understanding - Patient has urinary incontinence? No. - Patient is safe to resume physical and sexual activity  6.  Health Maintenance - HM due items addressed Yes - Last pap smear 12/04/20 (normal). Pap smear not done at today's visit.  -Breast Cancer screening indicated? No.   7. Chronic Disease/Pregnancy Condition follow up: None - PCP follow up as needed  Bernerd Limbo, CNM Center for [employer], [facility]

## 2022-03-11 NOTE — Progress Notes (Signed)
LACTATION VISIT ENCOUNTER NOTE  Subjective:   Lauren Jensen is a 31 y.o. G21P2012 female here for a lactation assessment. Current complaints: Baby has been working through a lip and tongue tie (both released within the last week, seen for follow up at pediatric dentist today), stridor both randomly at rest but also nursing. 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 Also going to have a swallow study done given his increased risk of aspiration.   Latching better after LT/TT revision but wants to have latch assessed.     Baby is currently feeding by Exclusively breastmilk. Breastmilk is being provided by  Breast/Chest  Mother is pumping - Yes  Reports Breast/Nipple are 2 = Soft / non-tender   Gynecologic History No LMP recorded.  Contraception: oral progesterone-only contraceptive  Health Maintenance Due  Topic Date Due   COVID-19 Vaccine (1) Never done   Hepatitis C Screening  Never done    The following portions of the patient's history were reviewed and updated as appropriate: allergies, current medications, past family history, past medical history, past social history, past surgical history and problem list.  Review of Systems Pertinent items noted in HPI and remainder of comprehensive ROS otherwise negative.   Objective:  BP 119/61   Pulse 81   Ht 5\' 6"  (1.676 m)   Wt 191 lb 3.2 oz (86.7 kg)   Breastfeeding Yes   BMI 30.86 kg/m  Gen: well appearing, NAD HEENT: no scleral icterus CV: RR Lung: Normal WOB Ext: warm well perfused  Breasts: lactating, no erythema or tenderness, right nipple misshapen (lipstick shape) even prior to latch, likely from previous repeated poor latches due to LT/TT.   Infant Oral assessment: Lip tie present - No (incision healing well) Effect on ability flip lip?  Yes (still learning) Tongue movement: moves well, cups nipple with good undulation Tongue lateralizing: full  lateralization Tongue tie: No - recently revised, healing well  Latch/feeding assessment: Infant showed feeding cues Yes. Roots normally Yes  RIGHT -  Latch: 1 - mother latched baby well but needed assistance keeping lips flared Audible swallows: 2 (heard easily) Type of nipple: 2 (everted, no damage) Comfort: 2 (denies pain/pinching) Hold/positioning: 2 (done independently - started in cradle, switched to baby sitting more upright, good breast support in order to manage flow)  LEFT -  Latch: 2 (independent without assistance) Audible swallows: 2 (heard easily) Type of nipple: 2 (everted, no damage) Comfort: 2 (denies pain/pinching) Hold/positioning: 2 (done independently - started in cradle, switched to baby sitting more upright, good breast support in order to manage flow)  Narrative: Baby cuing immediately upon arrival to room. Crying but easily latched to right breast with quick letdown and immediate stridor with some struggle to maintain latch with heavy flow. Taught breast hold to help flare lips out and ease chin down, baby able to continue latch with less discomfort at the breast. Baby fed with audible swallows and slowly relaxed stance for then unlatched himself. After burping, baby cued again and latched easily without assistance to the left breast. Fed vigorously for with audible gulps then unlatched himself, burped easily and was calm/relaxed.   Reassurance given that latch at breast is improving and reviewed progression of healing including initial cluster feeding, possibility for increased overflow spit-up as he adjusts to larger volume and mild fore/hindmilk imbalance and how to manage.   Assessment and Plan:  1. Lactating mother - Pt aware of  community lactation resources - Follow up with peds SLP/OT for swallow study although low suspicion for aspiration based on this feeding. More likely when LT/TT were not revised.  PRN follow up.  Bernerd Limbo, CNM,  [facility] for [facility]

## 2022-04-21 ENCOUNTER — Encounter: Payer: Self-pay | Admitting: Certified Nurse Midwife

## 2022-04-21 DIAGNOSIS — N941 Unspecified dyspareunia: Secondary | ICD-10-CM

## 2022-04-23 ENCOUNTER — Ambulatory Visit: Payer: Medicaid Other | Admitting: Physical Therapy

## 2022-04-23 IMAGING — US US MFM OB FOLLOW-UP
1 series · 14 of 28 positions shown · non-contrast
Comparison: none

[Series 1: us mfm ob follow-up · 14 of 44 slices shown]
[im 2/44]
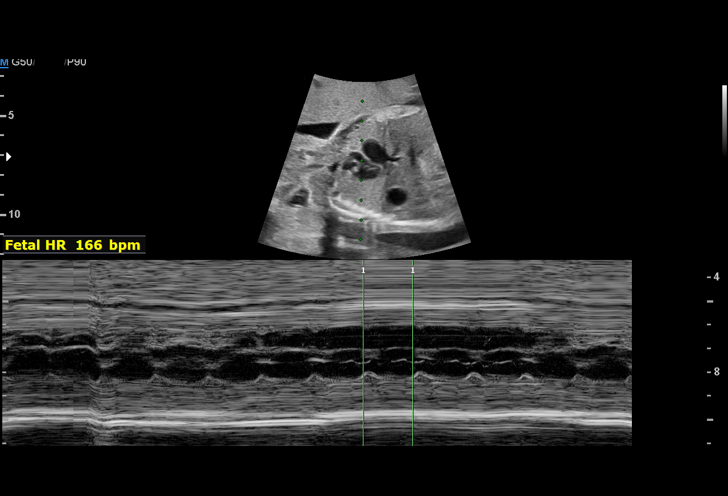
[im 5/44]
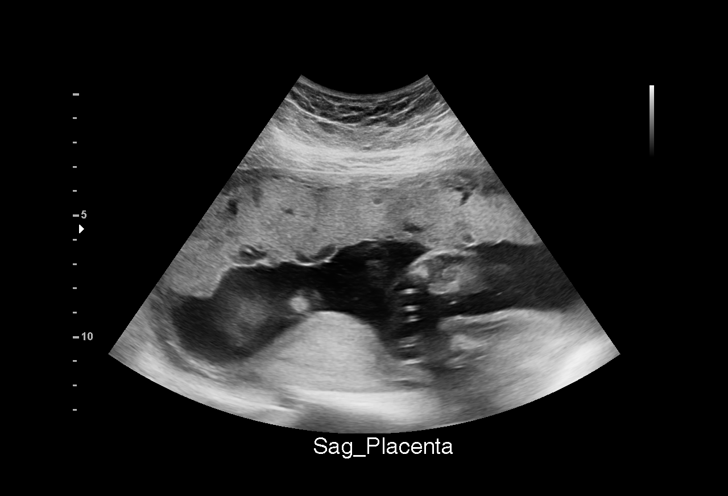
[im 8/44]
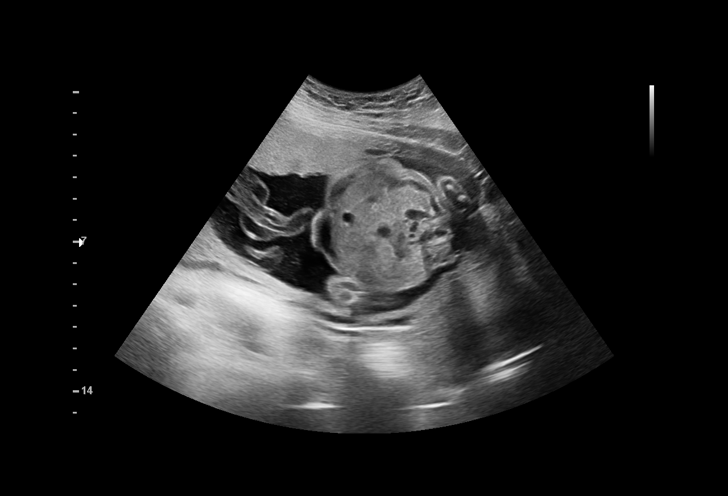
[im 12/44]
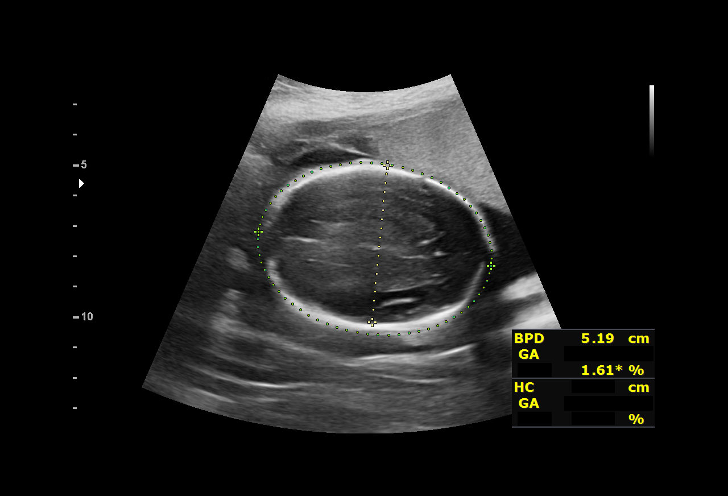
[im 15/44]
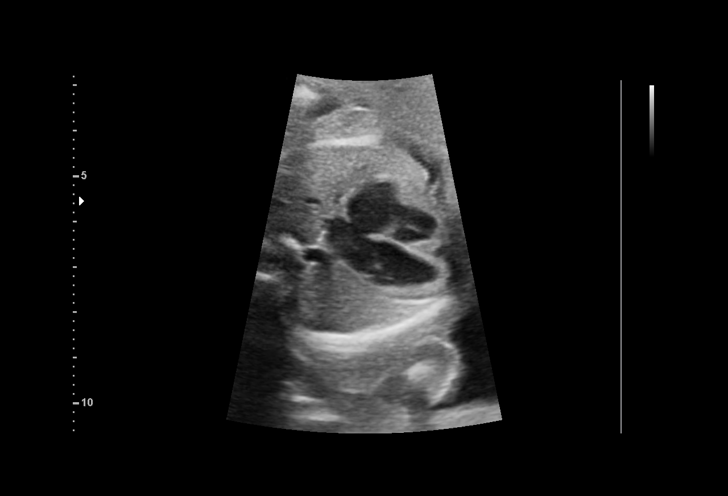
[im 18/44]
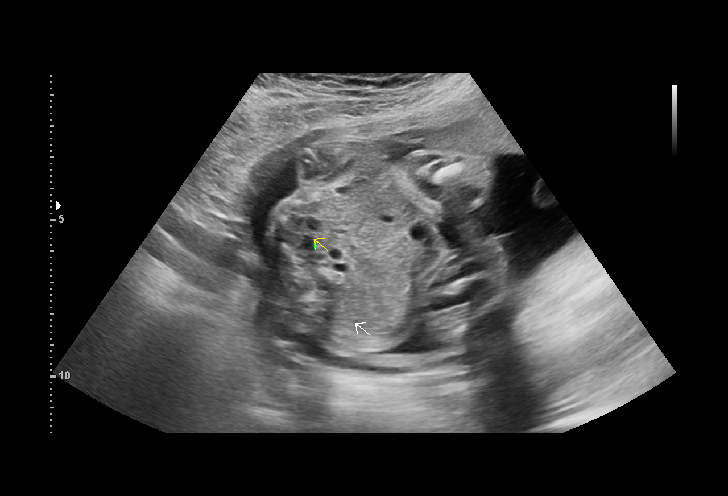
[im 21/44]
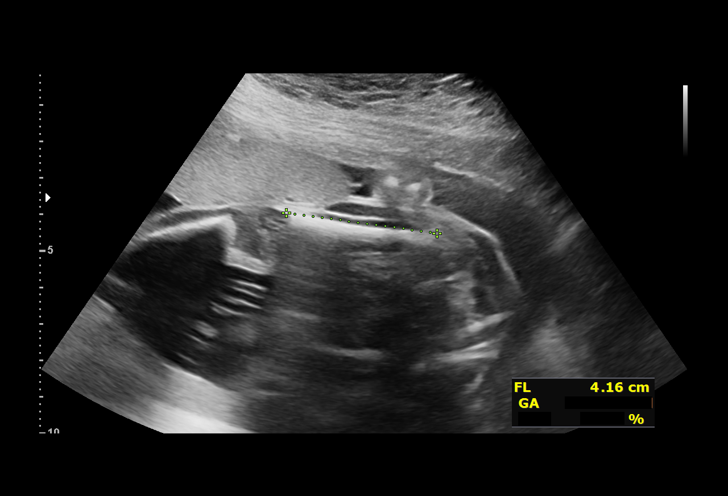
[im 24/44]
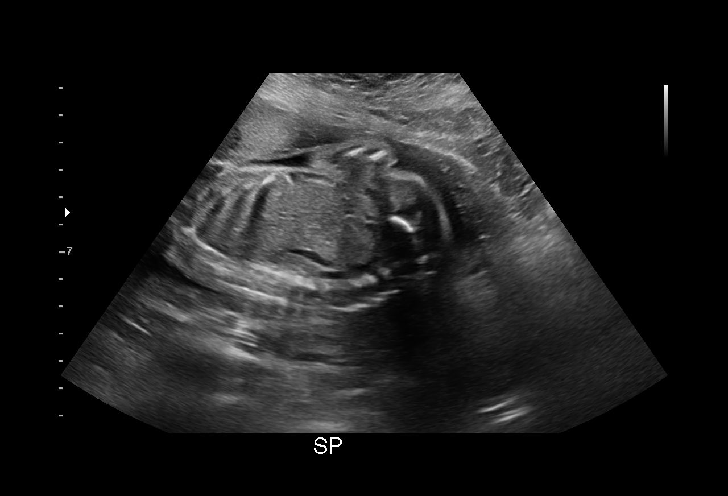
[im 28/44]
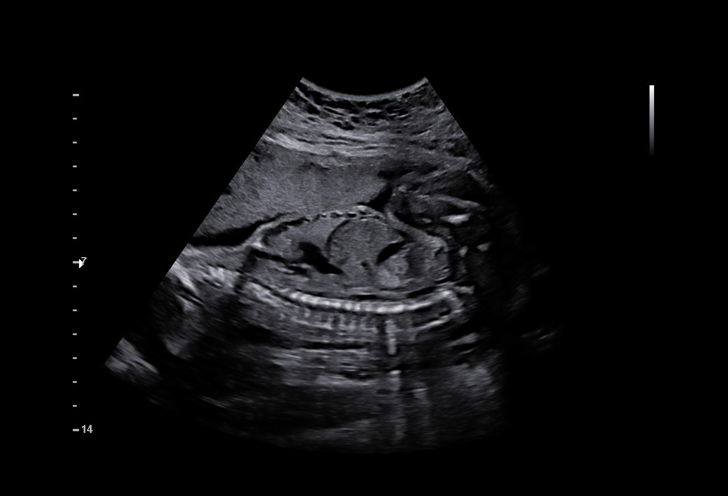
[im 31/44]
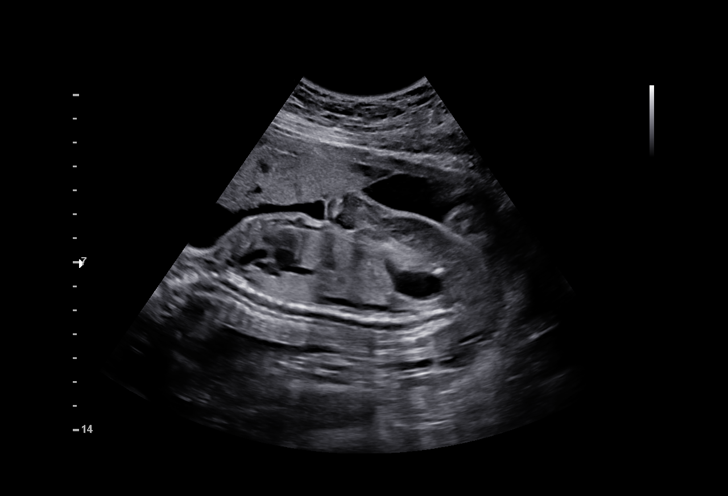
[im 34/44]
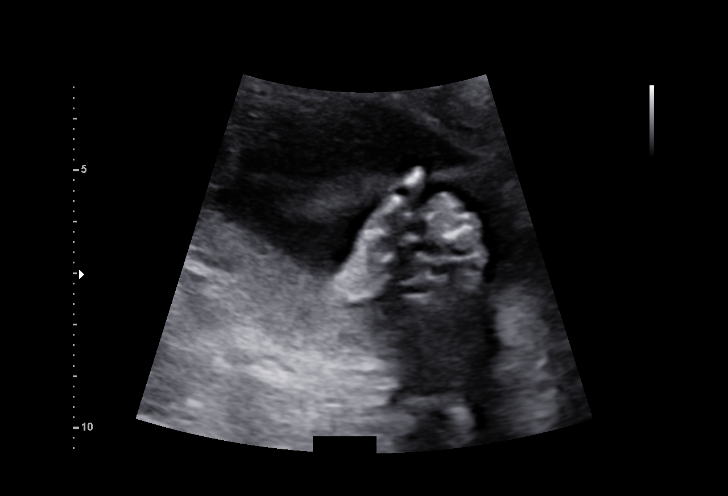
[im 37/44]
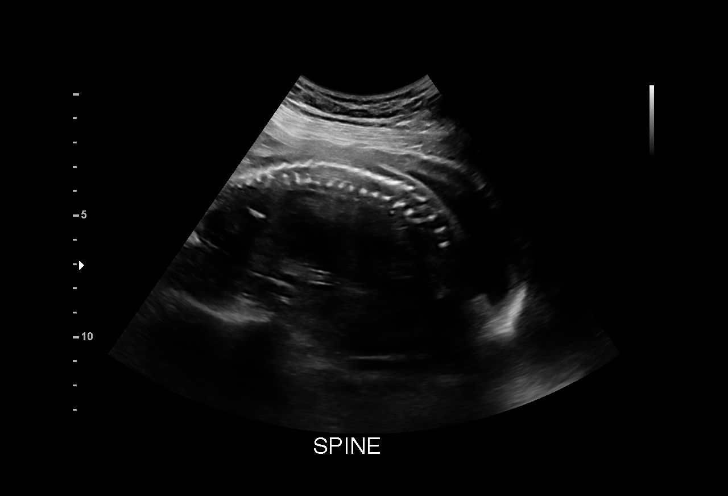
[im 40/44]
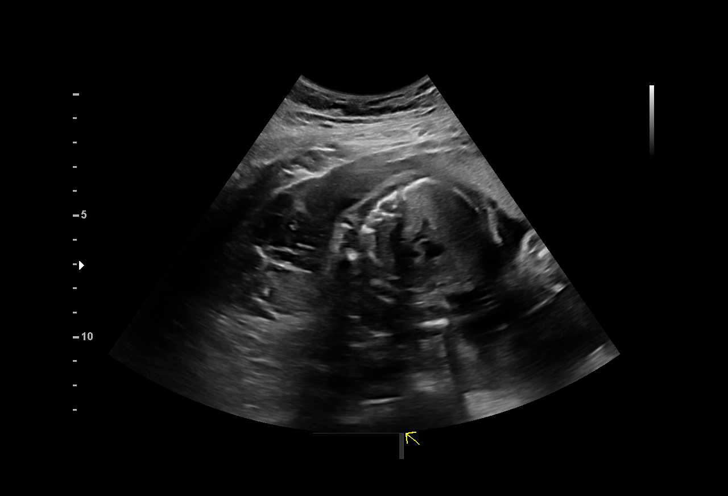
[im 44/44]
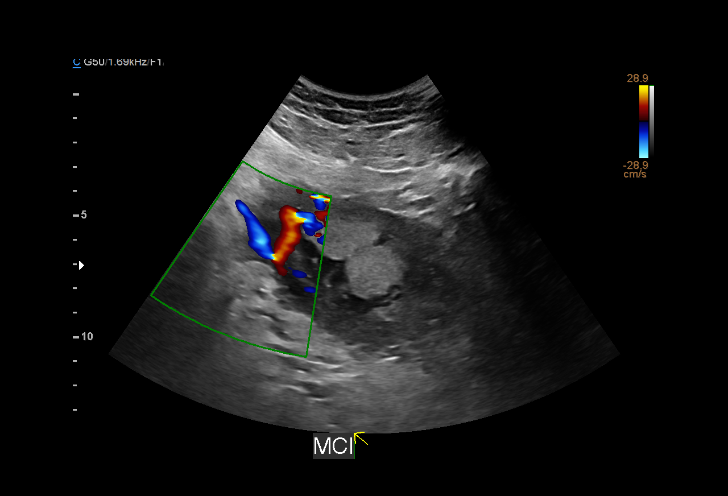

[14 of 28 positions shown; findings below may reference images not displayed]

JUTTEL

Indications

 23 weeks gestation of pregnancy
 Obesity complicating pregnancy, second
 trimester (BMI 30)
 LR NIPS (per pt)
Fetal Evaluation

 Num Of Fetuses:         1
 Fetal Heart Rate(bpm):  153
 Cardiac Activity:       Observed
 Presentation:           Breech
 Placenta:               Anterior
 P. Cord Insertion:      Previously Visualized

 Amniotic Fluid
 AFI FV:      Within normal limits
Biometry

 BPD:      52.9  mm     G. Age:  22w 0d        3.6  %    CI:        60.78   %    70 - 86
                                                         FL/HC:      18.9   %    18.7 -
 HC:      219.7  mm     G. Age:  24w 0d         44  %    HC/AC:      1.14        1.05 -
 AC:      193.2  mm     G. Age:  24w 0d         52  %    FL/BPD:     78.6   %    71 - 87
 FL:       41.6  mm     G. Age:  23w 4d         32  %    FL/AC:      21.5   %    20 - 24

 Est. FW:     624  gm      1 lb 6 oz     43  %
OB History
 Gravidity:    3         Term:   1        Prem:   0        SAB:   0
 TOP:          1       Ectopic:  0        Living: 1
Gestational Age

 LMP:           23w 5d        Date:  05/03/21                 EDD:   02/07/22
 U/S Today:     23w 3d                                        EDD:   02/09/22
 Best:          23w 5d     Det. By:  LMP  (05/03/21)          EDD:   02/07/22
Anatomy

 Cranium:               Appears normal         Aortic Arch:            Previously seen
 Cavum:                 Previously seen        Ductal Arch:            Previously seen
 Ventricles:            Appears normal         Diaphragm:              Previously seen
 Choroid Plexus:        Previously seen        Stomach:                Appears normal, left
                                                                       sided
 Cerebellum:            Previously seen        Abdomen:                Previously seen
 Posterior Fossa:       Previously seen        Abdominal Wall:         Previously seen
 Nuchal Fold:           Previously seen        Cord Vessels:           Previously seen
 Face:                  Orbits and profile     Kidneys:                Appear normal
                        previously seen
 Lips:                  Previously seen        Bladder:                Appears normal
 Thoracic:              Previously seen        Spine:                  Appears normal
 Heart:                 Appears normal         Upper Extremities:      Previously seen
                        (4CH, axis, and
                        situs)
 RVOT:                  Previously seen        Lower Extremities:      Previously seen
 LVOT:                  Previously seen

 Other:  VC, 3VV and 3VTV previously visualized. Male gender previously
         seen.
Cervix Uterus Adnexa

 Cervix
 Normal appearance by transabdominal scan.

 Right Ovary
 Visualized.

 Left Ovary
 Visualized.
Impression

 Marginal cord insertion.

 Patient returned for completion of fetal anatomy .Amniotic
 fluid is normal and good fetal activity is seen .Fetal biometry
 is consistent with her previously-established dates .Fetal
 anatomical survey was completed and appears normal.
Recommendations

 -An appointment was made for her to return in 5 weeks for
 fetal growth assessment.
                 Humphries, Joie

## 2022-04-24 ENCOUNTER — Other Ambulatory Visit: Payer: Self-pay

## 2022-04-24 ENCOUNTER — Ambulatory Visit: Payer: Medicaid Other | Attending: Certified Nurse Midwife | Admitting: Physical Therapy

## 2022-04-24 ENCOUNTER — Encounter: Payer: Self-pay | Admitting: Physical Therapy

## 2022-04-24 DIAGNOSIS — M62838 Other muscle spasm: Secondary | ICD-10-CM | POA: Insufficient documentation

## 2022-04-24 DIAGNOSIS — M6281 Muscle weakness (generalized): Secondary | ICD-10-CM | POA: Diagnosis present

## 2022-04-24 DIAGNOSIS — N941 Unspecified dyspareunia: Secondary | ICD-10-CM | POA: Insufficient documentation

## 2022-04-24 NOTE — Therapy (Signed)
OUTPATIENT PHYSICAL THERAPY FEMALE PELVIC EVALUATION   Patient Name: Lauren Jensen MRN: [id number] DOB:12-Nov-1990, 31 y.o., female Today's Date: 04/24/2022   PT End of Session - 04/24/22 1430     Visit Number 1    Date for PT Re-Evaluation 07/17/22    Authorization Type medicaid [employer]    PT Start Time 0803    PT Stop Time 0845    PT Time Calculation (min) 42 min    Activity Tolerance Patient tolerated treatment well    Behavior During Therapy [facility] for tasks assessed/performed             Past Medical History:  Diagnosis Date   Allergy    Anemia    Anxiety    Depression    Heart murmur    HSV-2 infection    Past Surgical History:  Procedure Laterality Date   NO PAST SURGERIES     Patient Active Problem List   Diagnosis Date Noted   Encounter for elective induction of labor 02/06/2022   SVD (spontaneous vaginal delivery) 02/06/2022   Postpartum care following vaginal delivery 02/06/2022   Marginal placenta 09/21/2021   Supervision of low-risk pregnancy 09/17/2021   Rh negative state in antepartum period 09/17/2021   HSV-2 infection 08/20/2021   GAD (generalized anxiety disorder) 08/16/2018   Seasonal allergies 08/16/2018    PCP: None  REFERRING PROVIDER: Bernerd Limbo, CNM  REFERRING DIAG: N94.10 (ICD-10-CM) - Dyspareunia in female  THERAPY DIAG:  Muscle weakness (generalized)  Other muscle spasm  Rationale for Evaluation and Treatment Rehabilitation  ONSET DATE: February 06, 2022  SUBJECTIVE:                                                                                                                                                                                           SUBJECTIVE STATEMENT: Pt has history of 2 vaginal deliveries and the most recent being February 06, 2022. She had periurethral tear after first delivery.  There was not as much pain and it was different.   Fluid intake: Yes: water     PAIN:  Are you having pain? No (only during  intercourse) NPRS scale: 4/10 Pain location: Vaginal  Pain type: burning Pain description: intermittent   Aggravating factors: intercourse Relieving factors: not having sex  PRECAUTIONS: None  WEIGHT BEARING RESTRICTIONS No  FALLS:  Has patient fallen in last 6 months? 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  PLOF: Independent  PATIENT GOALS be able to have intercourse without pain and not feel as low sensation, feel stronger  PERTINENT HISTORY:  2 vaginal deliveries Sexual abuse: No  BOWEL MOVEMENT Pain with bowel movement: No Type of bowel movement:Strain No Fully empty rectum: Yes:   Leakage: No Pads: No Fiber supplement: No  URINATION Pain with urination: No Fully empty bladder: Yes:   Stream: Strong Urgency: No Frequency: normal Leakage: Sneezing and but only tiny bit 2x Pads: No  INTERCOURSE Pain with intercourse: Initial Penetration and During Penetration Ability to have vaginal penetration:  Yes: have to change positions Climax: yes but difficult [name] Scale: 2/3  PREGNANCY Vaginal deliveries 2 Tearing Yes: small tear didn't need stitches this time   PROLAPSE None    OBJECTIVE:   DIAGNOSTIC FINDINGS:    PATIENT SURVEYS:    PFIQ-7 = 10  COGNITION:  Overall cognitive status: Within functional limits for tasks assessed     SENSATION:    MUSCLE LENGTH: Hamstrings: Right 80 deg; Left 80 deg   LUMBAR SPECIAL TESTS:  Straight leg raise test: Negative  FUNCTIONAL TESTS:  Single leg stand - trendelenburg bil  GAIT:  Comments: WFL               POSTURE: increased lumbar lordosis, decreased thoracic kyphosis, and anterior pelvic tilt   PELVIC ALIGNMENT:  LUMBARAROM/PROM  A/PROM A/PROM  eval  Flexion WFL  Extension   Right lateral flexion   Left lateral flexion   Right rotation   Left rotation    (Blank rows = not  tested)  LOWER EXTREMITY ROM:  Passive ROM Right eval Left eval  Hip flexion    Hip extension    Hip abduction    Hip adduction    Hip internal rotation    Hip external rotation    Knee flexion    Knee extension    Ankle dorsiflexion    Ankle plantarflexion    Ankle inversion    Ankle eversion     (Blank rows = not tested)  LOWER EXTREMITY MMT:  MMT Right eval Left eval  Hip flexion 5/5 5/5  Hip extension    Hip abduction 5/5 4/5  Hip adduction 5/5 5/5  Hip internal rotation 5/5 5/5  Hip external rotation 5/5 4/5  Knee flexion    Knee extension    Ankle dorsiflexion    Ankle plantarflexion    Ankle inversion    Ankle eversion      PALPATION:   General  gluteals and lumbar tight                External Perineal Exam perineal body descended and small scar present, dryness                             Internal Pelvic Floor tight levators and coccygeus bil; right vaginal wall more lax, no tenderness  Patient confirms identification and approves PT to assess internal pelvic floor and treatment Yes No emotional/communication barriers or cognitive limitation. Patient is motivated to learn. Patient understands and agrees with treatment goals and plan. PT explains patient will be examined in standing, sitting, and lying down to see how their muscles and joints work. When they are ready, they will be asked to remove their underwear so PT can examine their perineum. The patient is also given the option of providing their own chaperone as one is not provided in our facility. The patient also has the right and is explained the right to defer or refuse any part of the evaluation or treatment including  the internal exam. With the patient's consent, PT will use one gloved finger to gently assess the muscles of the pelvic floor, seeing how well it contracts and relaxes and if there is muscle symmetry. After, the patient will get dressed and PT and patient will discuss exam findings and  plan of care. PT and patient discuss plan of care, schedule, attendance policy and HEP activities.   PELVIC MMT:   MMT eval  Vaginal 3/5 (some weakness of pubovaginalis muscle; but tight and 4/5 coccygeus muscles); 20 sec hold; 10 reps  Internal Anal Sphincter   External Anal Sphincter   Puborectalis   Diastasis Recti   (Blank rows = not tested)        TONE: Mostly normal; higher in posterior pelvic floor and weaker vaginal closure  PROLAPSE: Posterior wall with valsalva descends to just above hymen remnants   TODAY'S TREATMENT  EVAL and educated on moisturizing and perineal massage   PATIENT EDUCATION:  Education details: moisturizing and perineal massage Person educated: Patient Education method: Explanation and Handouts Education comprehension: verbalized understanding   HOME EXERCISE PROGRAM: None given today  ASSESSMENT:  CLINICAL IMPRESSION: Patient is a friendly 31 y.o. female who was seen today for physical therapy evaluation and treatment for dyspareunia. Pt has scar and adhesive tissue in the posterior fourchette of the vaginal opening.  She has tight posterior pelvic floor.  Pt has some left hip weakness.  She has weakness of the pelvic floor with inability to keep vaginal closure with any resistance.  Pt has some weakness palpated with MMT of the abdomen.  No DRA is present.  Pt has tight lumbar and thoracic paraspinals, gluteals and hamstrings.  Overall, pt will benefit from skilled PT to address impairments so she can return to normal functional activities without pain.   OBJECTIVE IMPAIRMENTS decreased strength, increased fascial restrictions, increased muscle spasms, impaired flexibility, impaired tone, and pain.   ACTIVITY LIMITATIONS     PARTICIPATION LIMITATIONS: interpersonal relationship  PERSONAL FACTORS 1-2 comorbidities: 2 vaginal deliveries both causing tears in different places  are also affecting patient's functional outcome.   REHAB  POTENTIAL: Excellent  CLINICAL DECISION MAKING: Stable/uncomplicated  EVALUATION COMPLEXITY: Low   GOALS: Goals reviewed with patient? Yes  SHORT TERM GOALS: Target date: 05/22/2022  Ind with perineal massage Baseline: Goal status: INITIAL  2.  Ind with initial HEP Baseline:  Goal status: INITIAL    LONG TERM GOALS: Target date: 07/17/2022   Pt will be independent with advanced HEP to maintain improvements made throughout therapy  Baseline:  Goal status: INITIAL  2.  Pt will report 80% reduction of pain due to improvements in posture, strength, and muscle length  Baseline:  Goal status: INITIAL  3.  Pt will have 0/3 score of [name] scale  Baseline: 1-2/3 Goal status: INITIAL  4.  Pt will demonstrate 5/5 Lt hip abduction for improved posture in single leg stance Baseline:  Goal status: INITIAL  5.  PFIQ reduced to 0 Baseline: 10 Goal status: INITIAL   PLAN: PT FREQUENCY: 1x/week  PT DURATION: 12 weeks  PLANNED INTERVENTIONS: Therapeutic exercises, Therapeutic activity, Neuromuscular re-education, Balance training, Gait training, Patient/Family education, Self Care, Joint mobilization, Dry Needling, Electrical stimulation, Cryotherapy, Moist heat, Taping, Biofeedback, Manual therapy, and Re-evaluation  PLAN FOR NEXT SESSION: release to lumbar, gluteals and posterior pelvic floor; transversus abdominus and anterior pelvic floor strengthening; circles in qped, happy baby, etc and basic core strength   Junious Silk, PT 04/24/2022, 2:31 PM

## 2022-05-01 ENCOUNTER — Encounter: Payer: Self-pay | Admitting: *Deleted

## 2022-05-01 ENCOUNTER — Encounter: Payer: Self-pay | Admitting: Certified Nurse Midwife

## 2022-05-07 ENCOUNTER — Ambulatory Visit: Payer: Medicaid Other | Admitting: Physical Therapy

## 2022-05-12 ENCOUNTER — Ambulatory Visit: Payer: Medicaid Other | Attending: Certified Nurse Midwife | Admitting: Physical Therapy

## 2022-05-12 ENCOUNTER — Encounter: Payer: Self-pay | Admitting: Physical Therapy

## 2022-05-12 DIAGNOSIS — M6281 Muscle weakness (generalized): Secondary | ICD-10-CM | POA: Insufficient documentation

## 2022-05-12 DIAGNOSIS — M62838 Other muscle spasm: Secondary | ICD-10-CM | POA: Insufficient documentation

## 2022-05-12 NOTE — Therapy (Signed)
OUTPATIENT PHYSICAL THERAPY FEMALE PELVIC TREATMENT   Patient Name: Lauren Jensen MRN: [id number] DOB:[birth year], 31 y.o., female Today's Date: 05/12/2022   PT End of Session - 05/12/22 1224     Visit Number 2    Date for PT Re-Evaluation 07/17/22    Authorization Type medicaid [employer]    PT Start Time 1150    PT Stop Time 1228    PT Time Calculation (min) 38 min    Activity Tolerance Patient tolerated treatment well    Behavior During Therapy [facility] for tasks assessed/performed              Past Medical History:  Diagnosis Date   Allergy    Anemia    Anxiety    Depression    Heart murmur    HSV-2 infection    Past Surgical History:  Procedure Laterality Date   NO PAST SURGERIES     Patient Active Problem List   Diagnosis Date Noted   Encounter for elective induction of labor 02/06/2022   SVD (spontaneous vaginal delivery) 02/06/2022   Postpartum care following vaginal delivery 02/06/2022   Marginal placenta 09/21/2021   Supervision of low-risk pregnancy 09/17/2021   Rh negative state in antepartum period 09/17/2021   HSV-2 infection 08/20/2021   GAD (generalized anxiety disorder) 08/16/2018   Seasonal allergies 08/16/2018    PCP: None  REFERRING PROVIDER: Gabriel Carina, CNM  REFERRING DIAG: N94.10 (ICD-10-CM) - Dyspareunia in female  THERAPY DIAG:  Muscle weakness (generalized)  Other muscle spasm  Rationale for Evaluation and Treatment Rehabilitation  ONSET DATE: February 06, 2022  SUBJECTIVE:                                                                                                                                                                                          Pain is more in doggie style lying on my back.  SUBJECTIVE STATEMENT: Pt has history of 2 vaginal deliveries and the most recent being February 06, 2022. She had periurethral tear after first delivery.  There was not as much pain and it was different.   Fluid intake: Yes: water      PAIN:  Are you having pain? No (only during intercourse) NPRS scale: 4/10 Pain location: Vaginal  Pain type: burning Pain description: intermittent   Aggravating factors: intercourse Relieving factors: not having sex  PRECAUTIONS: None  WEIGHT BEARING RESTRICTIONS No  FALLS:  Has patient fallen in last 6 months? 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  PLOF: Independent  PATIENT GOALS be able to have  intercourse without pain and not feel as low sensation, feel stronger  PERTINENT HISTORY:  2 vaginal deliveries Sexual abuse: No  BOWEL MOVEMENT Pain with bowel movement: No Type of bowel movement:Strain No Fully empty rectum: Yes:   Leakage: No Pads: No Fiber supplement: No  URINATION Pain with urination: No Fully empty bladder: Yes:   Stream: Strong Urgency: No Frequency: normal Leakage: Sneezing and but only tiny bit 2x Pads: No  INTERCOURSE Pain with intercourse: Initial Penetration and During Penetration Ability to have vaginal penetration:  Yes: have to change positions Climax: yes but difficult [name] Scale: 2/3  PREGNANCY Vaginal deliveries 2 Tearing Yes: small tear didn't need stitches this time   PROLAPSE None    OBJECTIVE:   DIAGNOSTIC FINDINGS:    PATIENT SURVEYS:    PFIQ-7 = 10  COGNITION:  Overall cognitive status: Within functional limits for tasks assessed     SENSATION:    MUSCLE LENGTH: Hamstrings: Right 80 deg; Left 80 deg   LUMBAR SPECIAL TESTS:  Straight leg raise test: Negative  FUNCTIONAL TESTS:  Single leg stand - trendelenburg bil  GAIT:  Comments: WFL               POSTURE: increased lumbar lordosis, decreased thoracic kyphosis, and anterior pelvic tilt   PELVIC ALIGNMENT:  LUMBARAROM/PROM  A/PROM A/PROM  eval  Flexion WFL  Extension   Right lateral flexion   Left lateral flexion   Right  rotation   Left rotation    (Blank rows = not tested)  LOWER EXTREMITY ROM:  Passive ROM Right eval Left eval  Hip flexion    Hip extension    Hip abduction    Hip adduction    Hip internal rotation    Hip external rotation    Knee flexion    Knee extension    Ankle dorsiflexion    Ankle plantarflexion    Ankle inversion    Ankle eversion     (Blank rows = not tested)  LOWER EXTREMITY MMT:  MMT Right eval Left eval  Hip flexion 5/5 5/5  Hip extension    Hip abduction 5/5 4/5  Hip adduction 5/5 5/5  Hip internal rotation 5/5 5/5  Hip external rotation 5/5 4/5  Knee flexion    Knee extension    Ankle dorsiflexion    Ankle plantarflexion    Ankle inversion    Ankle eversion      PALPATION:   General  gluteals and lumbar tight                External Perineal Exam perineal body descended and small scar present, dryness                             Internal Pelvic Floor tight levators and coccygeus bil; right vaginal wall more lax, no tenderness  Patient confirms identification and approves PT to assess internal pelvic floor and treatment Yes No emotional/communication barriers or cognitive limitation. Patient is motivated to learn. Patient understands and agrees with treatment goals and plan. PT explains patient will be examined in standing, sitting, and lying down to see how their muscles and joints work. When they are ready, they will be asked to remove their underwear so PT can examine their perineum. The patient is also given the option of providing their own chaperone as one is not provided in our facility. The patient also has the right and is explained the right  to defer or refuse any part of the evaluation or treatment including the internal exam. With the patient's consent, PT will use one gloved finger to gently assess the muscles of the pelvic floor, seeing how well it contracts and relaxes and if there is muscle symmetry. After, the patient will get dressed and  PT and patient will discuss exam findings and plan of care. PT and patient discuss plan of care, schedule, attendance policy and HEP activities.   PELVIC MMT:   MMT eval  Vaginal 3/5 (some weakness of pubovaginalis muscle; but tight and 4/5 coccygeus muscles); 20 sec hold; 10 reps  Internal Anal Sphincter   External Anal Sphincter   Puborectalis   Diastasis Recti   (Blank rows = not tested)        TONE: Mostly normal; higher in posterior pelvic floor and weaker vaginal closure  PROLAPSE: Posterior wall with valsalva descends to just above hymen remnants   TODAY'S TREATMENT  Date: 05/12/22   Nuero Re-ed: Education and cues for coordination of breathing  Transversus abdominus activation  Exercises: Marching in hooklying Quadruped circles, cat cow, thoracic rotation Side lying thoracic rotation Thoracic ext noodle Foam roller to glutes Foam roller under sacrum and LE kicks Happy baby Manual: Gluteal, thoracic and lumbar paraspinals  PATIENT EDUCATION:  Education details: moisturizing and perineal massage Person educated: Patient Education method: Explanation and Handouts Education comprehension: verbalized understanding   HOME EXERCISE PROGRAM: Access Code: [email or web address] URL: [email or web address] Date: 05/12/2022 Prepared by: Jari Favre  Exercises - Quadruped Cat Cow  - 1 x daily - 7 x weekly - 3 sets - 10 reps - Quadruped Circle Weight Shifts  - 1 x daily - 7 x weekly - 3 sets - 10 reps - Hooklying Small March  - 1 x daily - 7 x weekly - 2 sets - 10 reps - Happy Baby with Pelvic Floor Lengthening  - 1 x daily - 7 x weekly - 1 sets - 3 reps - 30 hold - Supine Butterfly Groin Stretch  - 1 x daily - 7 x weekly - 1 sets - 3 reps - 30 sec hold  ASSESSMENT:  CLINICAL IMPRESSION: Pt did well with initial HEP.  She had very tight lumbar, glutes up to thoracic paraspinals.  Pt was given stretches to continue to address muscle length of posterior  postural muscles.  Pt was educated on basic transversus abdominus activation and exercises added to HEP.  Continue to progress per POC   OBJECTIVE IMPAIRMENTS decreased strength, increased fascial restrictions, increased muscle spasms, impaired flexibility, impaired tone, and pain.   ACTIVITY LIMITATIONS     PARTICIPATION LIMITATIONS: interpersonal relationship  PERSONAL FACTORS 1-2 comorbidities: 2 vaginal deliveries both causing tears in different places  are also affecting patient's functional outcome.   REHAB POTENTIAL: Excellent  CLINICAL DECISION MAKING: Stable/uncomplicated  EVALUATION COMPLEXITY: Low   GOALS: Goals reviewed with patient? Yes  SHORT TERM GOALS: Target date: 05/22/2022  Ind with perineal massage Baseline: Goal status: MET  2.  Ind with initial HEP Baseline:  Goal status: IN PROGRESS    LONG TERM GOALS: Target date: 07/17/2022   Pt will be independent with advanced HEP to maintain improvements made throughout therapy  Baseline:  Goal status: INITIAL  2.  Pt will report 80% reduction of pain due to improvements in posture, strength, and muscle length  Baseline:  Goal status: INITIAL  3.  Pt will have 0/3 score of [name] scale  Baseline: 1-2/3 Goal status: INITIAL  4.  Pt will demonstrate 5/5 Lt hip abduction for improved posture in single leg stance Baseline:  Goal status: INITIAL  5.  PFIQ reduced to 0 Baseline: 10 Goal status: INITIAL   PLAN: PT FREQUENCY: 1x/week  PT DURATION: 12 weeks  PLANNED INTERVENTIONS: Therapeutic exercises, Therapeutic activity, Neuromuscular re-education, Balance training, Gait training, Patient/Family education, Self Care, Joint mobilization, Dry Needling, Electrical stimulation, Cryotherapy, Moist heat, Taping, Biofeedback, Manual therapy, and Re-evaluation  PLAN FOR NEXT SESSION: internal STM in positions that elicit pain to promote improved fascia and nerve mobility of the perineal region   [employer], PT 05/12/2022, 12:36 PM

## 2022-05-18 NOTE — Therapy (Unsigned)
OUTPATIENT PHYSICAL THERAPY FEMALE PELVIC TREATMENT   Patient Name: Lauren Jensen MRN: [id number] DOB:1991/04/04, 31 y.o., female Today's Date: 05/19/2022   PT End of Session - 05/19/22 1003     Visit Number 3    Date for PT Re-Evaluation 07/17/22    Authorization Type medicaid [employer]    PT Start Time 0935    PT Stop Time 1005    PT Time Calculation (min) 30 min    Activity Tolerance Patient tolerated treatment well    Behavior During Therapy [facility] for tasks assessed/performed               Past Medical History:  Diagnosis Date   Allergy    Anemia    Anxiety    Depression    Heart murmur    HSV-2 infection    Past Surgical History:  Procedure Laterality Date   NO PAST SURGERIES     Patient Active Problem List   Diagnosis Date Noted   Encounter for elective induction of labor 02/06/2022   SVD (spontaneous vaginal delivery) 02/06/2022   Postpartum care following vaginal delivery 02/06/2022   Marginal placenta 09/21/2021   Supervision of low-risk pregnancy 09/17/2021   Rh negative state in antepartum period 09/17/2021   HSV-2 infection 08/20/2021   GAD (generalized anxiety disorder) 08/16/2018   Seasonal allergies 08/16/2018    PCP: None  REFERRING PROVIDER: Gabriel Carina, CNM  REFERRING DIAG: N94.10 (ICD-10-CM) - Dyspareunia in female  THERAPY DIAG:  Other muscle spasm  Muscle weakness (generalized)  Rationale for Evaluation and Treatment Rehabilitation  ONSET DATE: February 06, 2022  SUBJECTIVE:                                                                                                                                                                                          Haven't had intercourse but no pain just during typical activities.  I did the exercises one time  SUBJECTIVE STATEMENT: Pt has history of 2 vaginal deliveries and the most recent being February 06, 2022. She had periurethral tear after first delivery.  There was not as much pain  and it was different.   Fluid intake: Yes: water     PAIN:  Are you having pain? No (only during intercourse)  PRECAUTIONS: None  WEIGHT BEARING RESTRICTIONS No  FALLS:  Has patient fallen in last 6 months? 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  PLOF: Independent  PATIENT GOALS be able to have intercourse without pain and not feel as low sensation, feel stronger  PERTINENT HISTORY:  2 vaginal deliveries Sexual abuse: No  BOWEL MOVEMENT Pain with bowel movement: No Type of bowel movement:Strain No Fully empty rectum: Yes:   Leakage: No Pads: No Fiber supplement: No  URINATION Pain with urination: No Fully empty bladder: Yes:   Stream: Strong Urgency: No Frequency: normal Leakage: Sneezing and but only tiny bit 2x Pads: No  INTERCOURSE Pain with intercourse: Initial Penetration and During Penetration Ability to have vaginal penetration:  Yes: have to change positions Climax: yes but difficult [name] Scale: 2/3  PREGNANCY Vaginal deliveries 2 Tearing Yes: small tear didn't need stitches this time   PROLAPSE None    OBJECTIVE:   DIAGNOSTIC FINDINGS:    PATIENT SURVEYS:    PFIQ-7 = 10  COGNITION:  Overall cognitive status: Within functional limits for tasks assessed     SENSATION:    MUSCLE LENGTH: Hamstrings: Right 80 deg; Left 80 deg   LUMBAR SPECIAL TESTS:  Straight leg raise test: Negative  FUNCTIONAL TESTS:  Single leg stand - trendelenburg bil  GAIT:  Comments: WFL               POSTURE: increased lumbar lordosis, decreased thoracic kyphosis, and anterior pelvic tilt   PELVIC ALIGNMENT:  LUMBARAROM/PROM  A/PROM A/PROM  eval  Flexion WFL  Extension   Right lateral flexion   Left lateral flexion   Right rotation   Left rotation    (Blank rows = not tested)  LOWER EXTREMITY ROM:  Passive ROM Right eval  Left eval  Hip flexion    Hip extension    Hip abduction    Hip adduction    Hip internal rotation    Hip external rotation    Knee flexion    Knee extension    Ankle dorsiflexion    Ankle plantarflexion    Ankle inversion    Ankle eversion     (Blank rows = not tested)  LOWER EXTREMITY MMT:  MMT Right eval Left eval  Hip flexion 5/5 5/5  Hip extension    Hip abduction 5/5 4/5  Hip adduction 5/5 5/5  Hip internal rotation 5/5 5/5  Hip external rotation 5/5 4/5  Knee flexion    Knee extension    Ankle dorsiflexion    Ankle plantarflexion    Ankle inversion    Ankle eversion      PALPATION:   General  gluteals and lumbar tight                External Perineal Exam perineal body descended and small scar present, dryness                             Internal Pelvic Floor tight levators and coccygeus bil; right vaginal wall more lax, no tenderness  Patient confirms identification and approves PT to assess internal pelvic floor and treatment Yes No emotional/communication barriers or cognitive limitation. Patient is motivated to learn. Patient understands and agrees with treatment goals and plan. PT explains patient will be examined in standing, sitting, and lying down to see how their muscles and joints work. When they are ready, they will be asked to remove their underwear so PT can examine their perineum. The patient is also given the option of providing their own chaperone as one is not provided in our facility. The patient also has the right and is explained the right to defer or refuse any part of the evaluation or treatment including the internal exam.  With the patient's consent, PT will use one gloved finger to gently assess the muscles of the pelvic floor, seeing how well it contracts and relaxes and if there is muscle symmetry. After, the patient will get dressed and PT and patient will discuss exam findings and plan of care. PT and patient discuss plan of care, schedule,  attendance policy and HEP activities.   PELVIC MMT:   MMT eval  Vaginal 3/5 (some weakness of pubovaginalis muscle; but tight and 4/5 coccygeus muscles); 20 sec hold; 10 reps  Internal Anal Sphincter   External Anal Sphincter   Puborectalis   Diastasis Recti   (Blank rows = not tested)        TONE: Mostly normal; higher in posterior pelvic floor and weaker vaginal closure  PROLAPSE: Posterior wall with valsalva descends to just above hymen remnants   TODAY'S TREATMENT  Date: 05/19/22    Manual: Gluteal, thoracic and lumbar paraspinals Patient confirms identification and approves physical therapist to perform internal soft tissue work  Perineal body in various positions supine, sidelying, quadruped Levators bil  PATIENT EDUCATION:  Education details: moisturizing and perineal massage Person educated: Patient Education method: Explanation and Handouts Education comprehension: verbalized understanding   HOME EXERCISE PROGRAM: Access Code: [email or web address] URL: [email or web address] Date: 05/12/2022 Prepared by: Jari Favre  Exercises - Quadruped Cat Cow  - 1 x daily - 7 x weekly - 3 sets - 10 reps - Quadruped Circle Weight Shifts  - 1 x daily - 7 x weekly - 3 sets - 10 reps - Hooklying Small March  - 1 x daily - 7 x weekly - 2 sets - 10 reps - Happy Baby with Pelvic Floor Lengthening  - 1 x daily - 7 x weekly - 1 sets - 3 reps - 30 hold - Supine Butterfly Groin Stretch  - 1 x daily - 7 x weekly - 1 sets - 3 reps - 30 sec hold  ASSESSMENT:  CLINICAL IMPRESSION: Pt hasn't had intercourse since last visit . No pain otherwise . Pt still has some pain with internal soft tissue release more on the right side and taught band of tissue present more on the right.  Tight and some tenderness to bil levators.  Pt was given other ways to do the perineal massage with moving LE and doing in quadruped positions.  Pt will benefit from skilled PT to continue to address  muscle length for improved pain management.   OBJECTIVE IMPAIRMENTS decreased strength, increased fascial restrictions, increased muscle spasms, impaired flexibility, impaired tone, and pain.   ACTIVITY LIMITATIONS     PARTICIPATION LIMITATIONS: interpersonal relationship  PERSONAL FACTORS 1-2 comorbidities: 2 vaginal deliveries both causing tears in different places  are also affecting patient's functional outcome.   REHAB POTENTIAL: Excellent  CLINICAL DECISION MAKING: Stable/uncomplicated  EVALUATION COMPLEXITY: Low   GOALS: Goals reviewed with patient? Yes  SHORT TERM GOALS: Target date: 05/22/2022 Updated 05/19/22  Ind with perineal massage Baseline: Goal status: MET  2.  Ind with initial HEP Baseline:  Goal status: MET    LONG TERM GOALS: Target date: 07/17/2022   Pt will be independent with advanced HEP to maintain improvements made throughout therapy  Baseline:  Goal status: INITIAL  2.  Pt will report 80% reduction of pain due to improvements in posture, strength, and muscle length  Baseline:  Goal status: INITIAL  3.  Pt will have 0/3 score of [name] scale  Baseline: 1-2/3 Goal status: INITIAL  4.  Pt will demonstrate  5/5 Lt hip abduction for improved posture in single leg stance Baseline:  Goal status: INITIAL  5.  PFIQ reduced to 0 Baseline: 10 Goal status: INITIAL   PLAN: PT FREQUENCY: 1x/week  PT DURATION: 12 weeks  PLANNED INTERVENTIONS: Therapeutic exercises, Therapeutic activity, Neuromuscular re-education, Balance training, Gait training, Patient/Family education, Self Care, Joint mobilization, Dry Needling, Electrical stimulation, Cryotherapy, Moist heat, Taping, Biofeedback, Manual therapy, and Re-evaluation  PLAN FOR NEXT SESSION: internal STM to fascia and muscle to improve nerve mobility of the perineal region; try getting to coccygeus muscles next   [employer], PT 05/19/2022, 10:03 AM

## 2022-05-19 ENCOUNTER — Encounter: Payer: Self-pay | Admitting: Physical Therapy

## 2022-05-19 ENCOUNTER — Ambulatory Visit: Payer: Medicaid Other | Admitting: Physical Therapy

## 2022-05-19 DIAGNOSIS — M6281 Muscle weakness (generalized): Secondary | ICD-10-CM

## 2022-05-19 DIAGNOSIS — M62838 Other muscle spasm: Secondary | ICD-10-CM

## 2022-05-28 NOTE — Therapy (Signed)
OUTPATIENT PHYSICAL THERAPY FEMALE PELVIC TREATMENT   Patient Name: Lauren Jensen MRN: [id number] DOB:02-13-1991, 31 y.o., female Today's Date: 05/29/2022   PT End of Session - 05/29/22 1027     Visit Number 4    Number of Visits 10    Date for PT Re-Evaluation 07/17/22    Authorization Type medicaid [employer] - until 10/17    PT Start Time 1024    PT Stop Time 1104    PT Time Calculation (min) 40 min    Activity Tolerance Patient tolerated treatment well    Behavior During Therapy [facility] for tasks assessed/performed                Past Medical History:  Diagnosis Date   Allergy    Anemia    Anxiety    Depression    Heart murmur    HSV-2 infection    Past Surgical History:  Procedure Laterality Date   NO PAST SURGERIES     Patient Active Problem List   Diagnosis Date Noted   Encounter for elective induction of labor 02/06/2022   SVD (spontaneous vaginal delivery) 02/06/2022   Postpartum care following vaginal delivery 02/06/2022   Marginal placenta 09/21/2021   Supervision of low-risk pregnancy 09/17/2021   Rh negative state in antepartum period 09/17/2021   HSV-2 infection 08/20/2021   GAD (generalized anxiety disorder) 08/16/2018   Seasonal allergies 08/16/2018    PCP: None  REFERRING PROVIDER: Gabriel Carina, CNM  REFERRING DIAG: N94.10 (ICD-10-CM) - Dyspareunia in female  THERAPY DIAG:  Other muscle spasm  Muscle weakness (generalized)  Rationale for Evaluation and Treatment Rehabilitation  ONSET DATE: February 06, 2022  SUBJECTIVE:                                                                                                                                                                                          Still haven't had intercourse but no pain just during typical activities.  I am having a little upper back and neck tension but it was worse yesterday SUBJECTIVE STATEMENT: Pt has history of 2 vaginal deliveries and the most recent being  February 06, 2022. She had periurethral tear after first delivery.  There was not as much pain and it was different.   Fluid intake: Yes: water     PAIN:  Are you having pain? No (only during intercourse)  PRECAUTIONS: None  WEIGHT BEARING RESTRICTIONS No  FALLS:  Has patient fallen in last 6 months? 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  PLOF: Independent  PATIENT  GOALS be able to have intercourse without pain and not feel as low sensation, feel stronger  PERTINENT HISTORY:  2 vaginal deliveries Sexual abuse: No  BOWEL MOVEMENT Pain with bowel movement: No Type of bowel movement:Strain No Fully empty rectum: Yes:   Leakage: No Pads: No Fiber supplement: No  URINATION Pain with urination: No Fully empty bladder: Yes:   Stream: Strong Urgency: No Frequency: normal Leakage: Sneezing and but only tiny bit 2x Pads: No  INTERCOURSE Pain with intercourse: Initial Penetration and During Penetration Ability to have vaginal penetration:  Yes: have to change positions Climax: yes but difficult [name] Scale: 2/3  PREGNANCY Vaginal deliveries 2 Tearing Yes: small tear didn't need stitches this time   PROLAPSE None    OBJECTIVE:   DIAGNOSTIC FINDINGS:    PATIENT SURVEYS:    PFIQ-7 = 10  COGNITION:  Overall cognitive status: Within functional limits for tasks assessed     SENSATION:    MUSCLE LENGTH: Hamstrings: Right 80 deg; Left 80 deg   LUMBAR SPECIAL TESTS:  Straight leg raise test: Negative  FUNCTIONAL TESTS:  Single leg stand - trendelenburg bil  GAIT:  Comments: WFL               POSTURE: increased lumbar lordosis, decreased thoracic kyphosis, and anterior pelvic tilt   PELVIC ALIGNMENT:  LUMBARAROM/PROM  A/PROM A/PROM  eval  Flexion WFL  Extension   Right lateral flexion   Left lateral flexion   Right rotation   Left rotation     (Blank rows = not tested)  LOWER EXTREMITY ROM:  Passive ROM Right eval Left eval  Hip flexion    Hip extension    Hip abduction    Hip adduction    Hip internal rotation    Hip external rotation    Knee flexion    Knee extension    Ankle dorsiflexion    Ankle plantarflexion    Ankle inversion    Ankle eversion     (Blank rows = not tested)  LOWER EXTREMITY MMT:  MMT Right eval Left eval  Hip flexion 5/5 5/5  Hip extension    Hip abduction 5/5 4/5  Hip adduction 5/5 5/5  Hip internal rotation 5/5 5/5  Hip external rotation 5/5 4/5  Knee flexion    Knee extension    Ankle dorsiflexion    Ankle plantarflexion    Ankle inversion    Ankle eversion      PALPATION:   General  gluteals and lumbar tight                External Perineal Exam perineal body descended and small scar present, dryness                             Internal Pelvic Floor tight levators and coccygeus bil; right vaginal wall more lax, no tenderness  Patient confirms identification and approves PT to assess internal pelvic floor and treatment Yes No emotional/communication barriers or cognitive limitation. Patient is motivated to learn. Patient understands and agrees with treatment goals and plan. PT explains patient will be examined in standing, sitting, and lying down to see how their muscles and joints work. When they are ready, they will be asked to remove their underwear so PT can examine their perineum. The patient is also given the option of providing their own chaperone as one is not provided in our facility. The patient also has the right  and is explained the right to defer or refuse any part of the evaluation or treatment including the internal exam. With the patient's consent, PT will use one gloved finger to gently assess the muscles of the pelvic floor, seeing how well it contracts and relaxes and if there is muscle symmetry. After, the patient will get dressed and PT and patient will  discuss exam findings and plan of care. PT and patient discuss plan of care, schedule, attendance policy and HEP activities.   PELVIC MMT:   MMT eval  Vaginal 3/5 (some weakness of pubovaginalis muscle; but tight and 4/5 coccygeus muscles); 20 sec hold; 10 reps  Internal Anal Sphincter   External Anal Sphincter   Puborectalis   Diastasis Recti   (Blank rows = not tested)        TONE: Mostly normal; higher in posterior pelvic floor and weaker vaginal closure  PROLAPSE: Posterior wall with valsalva descends to just above hymen remnants    TODAY'S TREATMENT   Date: 05/29/22  Manual: Gluteal, thoracic and lumbar paraspinals Patient confirms identification and approves physical therapist to perform internal soft tissue work  Perineal body in various positions supine, sidelying, quadruped Levators bil STM lumbar paraspinals  Exercise: Hip abduction side lying - 15x bil Quadruped UE reaches; add LE/UE reaches - 10x each Standing against wall with core engagement while lifting a weighted ball  Wall squats with core engagement  - cues to keep back flat - 12 x Pallof press -  PATIENT EDUCATION:  Education details: moisturizing and perineal massage Person educated: Patient Education method: Explanation and Handouts Education comprehension: verbalized understanding   HOME EXERCISE PROGRAM:  Date: 05/19/22  Manual: Gluteal, thoracic and lumbar paraspinals Patient confirms identification and approves physical therapist to perform internal soft tissue work  Perineal body in various positions supine, sidelying, quadruped Levators bil  PATIENT EDUCATION:  Education details: moisturizing and perineal massage Person educated: Patient Education method: Explanation and Handouts Education comprehension: verbalized understanding   HOME EXERCISE PROGRAM: Access Code: [email or web address] URL: [email or web address] Date: 05/12/2022 Prepared by: Jari Favre  Exercises - Quadruped Cat Cow  - 1 x daily - 7 x weekly - 3 sets - 10 reps - Quadruped Circle Weight Shifts  - 1 x daily - 7 x weekly - 3 sets - 10 reps - Hooklying Small March  - 1 x daily - 7 x weekly - 2 sets - 10 reps - Happy Baby with Pelvic Floor Lengthening  - 1 x daily - 7 x weekly - 1 sets - 3 reps - 30 hold - Supine Butterfly Groin Stretch  - 1 x daily - 7 x weekly - 1 sets - 3 reps - 30 sec hold  ASSESSMENT:  CLINICAL IMPRESSION: Pt hasn't had intercourse but has been noticing soreness in her back and neck when working. Pt tolerated internal STM and was uncomfortable but no increased pain afterwards.  Today's treatment focused on posture and core strength in functional positions in order to manage back pain and pelvic floor spasms.  She was given updates to the HEP to progress her core strength and posture.  Pt will benefit from skilled PT to continue to address muscle length for improved pain management.   OBJECTIVE IMPAIRMENTS decreased strength, increased fascial restrictions, increased muscle spasms, impaired flexibility, impaired tone, and pain.   ACTIVITY LIMITATIONS     PARTICIPATION LIMITATIONS: interpersonal relationship  PERSONAL FACTORS 1-2 comorbidities: 2 vaginal deliveries both causing tears in different places  are also affecting  patient's functional outcome.   REHAB POTENTIAL: Excellent  CLINICAL DECISION MAKING: Stable/uncomplicated  EVALUATION COMPLEXITY: Low   GOALS: Goals reviewed with patient? Yes  SHORT TERM GOALS: Target date: 05/22/2022 Updated 05/19/22  Ind with perineal massage Baseline: Goal status: MET  2.  Ind with initial HEP Baseline:  Goal status: MET    LONG TERM GOALS: Target date: 07/17/2022   Pt will be independent with advanced HEP to maintain improvements made throughout therapy  Baseline:  Goal status: IN PROGRESS  2.  Pt will report 80% reduction of pain due to improvements in posture, strength, and muscle  length  Baseline:  Goal status: IN PROGRESS  3.  Pt will have 0/3 score of [name] scale  Baseline: 1-2/3 Goal status: IN PROGRESS  4.  Pt will demonstrate 5/5 Lt hip abduction for improved posture in single leg stance Baseline:  Goal status: IN PROGRESS  5.  PFIQ reduced to 0 Baseline: 10 Goal status: IN PROGRESS   PLAN: PT FREQUENCY: 1x/week  PT DURATION: 12 weeks  PLANNED INTERVENTIONS: Therapeutic exercises, Therapeutic activity, Neuromuscular re-education, Balance training, Gait training, Patient/Family education, Self Care, Joint mobilization, Dry Needling, Electrical stimulation, Cryotherapy, Moist heat, Taping, Biofeedback, Manual therapy, and Re-evaluation  PLAN FOR NEXT SESSION: continue with core and glute med strength; obliques in tall and half kneel   [employer], PT 05/29/2022, 12:01 PM

## 2022-05-29 ENCOUNTER — Ambulatory Visit: Payer: Medicaid Other | Admitting: Physical Therapy

## 2022-05-29 DIAGNOSIS — M62838 Other muscle spasm: Secondary | ICD-10-CM

## 2022-05-29 DIAGNOSIS — M6281 Muscle weakness (generalized): Secondary | ICD-10-CM

## 2022-06-05 ENCOUNTER — Ambulatory Visit: Payer: Medicaid Other | Admitting: Physical Therapy

## 2022-06-12 ENCOUNTER — Encounter: Payer: Self-pay | Admitting: Physical Therapy

## 2022-06-12 ENCOUNTER — Ambulatory Visit: Payer: Medicaid Other | Attending: Certified Nurse Midwife | Admitting: Physical Therapy

## 2022-06-12 DIAGNOSIS — M6281 Muscle weakness (generalized): Secondary | ICD-10-CM | POA: Diagnosis present

## 2022-06-12 DIAGNOSIS — M62838 Other muscle spasm: Secondary | ICD-10-CM | POA: Diagnosis present

## 2022-06-12 NOTE — Therapy (Addendum)
OUTPATIENT PHYSICAL THERAPY FEMALE PELVIC TREATMENT   Patient Name: Lauren Jensen MRN: [id number] DOB:11/12/1990, 31 y.o., female Today's Date: 06/12/2022   PT End of Session - 06/12/22 1018     Visit Number 5    Number of Visits 10    Date for PT Re-Evaluation 07/17/22    Authorization Type medicaid [facility] - until 10/17    PT Start Time 1018    PT Stop Time 1105    PT Time Calculation (min) 47 min    Activity Tolerance Patient tolerated treatment well    Behavior During Therapy [facility] for tasks assessed/performed                 Past Medical History:  Diagnosis Date   Allergy    Anemia    Anxiety    Depression    Heart murmur    HSV-2 infection    Past Surgical History:  Procedure Laterality Date   NO PAST SURGERIES     Patient Active Problem List   Diagnosis Date Noted   Encounter for elective induction of labor 02/06/2022   SVD (spontaneous vaginal delivery) 02/06/2022   Postpartum care following vaginal delivery 02/06/2022   Marginal placenta 09/21/2021   Supervision of low-risk pregnancy 09/17/2021   Rh negative state in antepartum period 09/17/2021   HSV-2 infection 08/20/2021   GAD (generalized anxiety disorder) 08/16/2018   Seasonal allergies 08/16/2018    PCP: None  REFERRING PROVIDER: Gabriel Carina, CNM  REFERRING DIAG: N94.10 (ICD-10-CM) - Dyspareunia in female  THERAPY DIAG:  Other muscle spasm  Muscle weakness (generalized)  Rationale for Evaluation and Treatment Rehabilitation  ONSET DATE: February 06, 2022  SUBJECTIVE:                                                                                                                                                                                           She said she hasn't had intercourse.She is not having pain right now.  Life has been very stressful so she hasn't be able to do a lot of her exercise and stretches.    EVALUATION SUBJECTIVE STATEMENT: Pt has history of 2 vaginal  deliveries and the most recent being February 06, 2022. She had periurethral tear after first delivery.  There was not as much pain and it was different.   Fluid intake: Yes: water     PAIN:  Are you having pain? No (only during intercourse)  PRECAUTIONS: None  WEIGHT BEARING RESTRICTIONS No  FALLS:  Has patient fallen in last 6 months? No  LIVING ENVIRONMENT: Lives with: lives with their family Lives in: House/apartment   OCCUPATION:  nurse at [employer]  PLOF: Independent  PATIENT GOALS be able to have intercourse without pain and not feel as low sensation, feel stronger  PERTINENT HISTORY:  2 vaginal deliveries Sexual abuse: No  BOWEL MOVEMENT Pain with bowel movement: No Type of bowel movement:Strain No Fully empty rectum: Yes:   Leakage: No Pads: No Fiber supplement: No  URINATION Pain with urination: No Fully empty bladder: Yes:   Stream: Strong Urgency: No Frequency: normal Leakage: Sneezing and but only tiny bit 2x Pads: No  INTERCOURSE Pain with intercourse: Initial Penetration and During Penetration Ability to have vaginal penetration:  Yes: have to change positions Climax: yes but difficult [name] Scale: 2/3  PREGNANCY Vaginal deliveries 2 Tearing Yes: small tear didn't need stitches this time   PROLAPSE None    OBJECTIVE:   DIAGNOSTIC FINDINGS:    PATIENT SURVEYS:    PFIQ-7 = 10  COGNITION:  Overall cognitive status: Within functional limits for tasks assessed     SENSATION:    MUSCLE LENGTH: Hamstrings: Right 80 deg; Left 80 deg   LUMBAR SPECIAL TESTS:  Straight leg raise test: Negative  FUNCTIONAL TESTS:  Single leg stand - trendelenburg bil  GAIT:  Comments: WFL               POSTURE: increased lumbar lordosis, decreased thoracic kyphosis, and anterior pelvic tilt   PELVIC ALIGNMENT:  LUMBARAROM/PROM  A/PROM A/PROM  eval  Flexion WFL  Extension   Right lateral flexion   Left lateral  flexion   Right rotation   Left rotation    (Blank rows = not tested)  LOWER EXTREMITY ROM:  Passive ROM Right eval Left eval  Hip flexion    Hip extension    Hip abduction    Hip adduction    Hip internal rotation    Hip external rotation    Knee flexion    Knee extension    Ankle dorsiflexion    Ankle plantarflexion    Ankle inversion    Ankle eversion     (Blank rows = not tested)  LOWER EXTREMITY MMT:  MMT Right eval Left eval  Hip flexion 5/5 5/5  Hip extension    Hip abduction 5/5 4/5  Hip adduction 5/5 5/5  Hip internal rotation 5/5 5/5  Hip external rotation 5/5 4/5  Knee flexion    Knee extension    Ankle dorsiflexion    Ankle plantarflexion    Ankle inversion    Ankle eversion      PALPATION:   General  gluteals and lumbar tight                External Perineal Exam perineal body descended and small scar present, dryness                             Internal Pelvic Floor tight levators and coccygeus bil; right vaginal wall more lax, no tenderness  Patient confirms identification and approves PT to assess internal pelvic floor and treatment Yes No emotional/communication barriers or cognitive limitation. Patient is motivated to learn. Patient understands and agrees with treatment goals and plan. PT explains patient will be examined in standing, sitting, and lying down to see how their muscles and joints work. When they are ready, they will be asked to remove their underwear so PT can examine their perineum. The patient is also given the option of providing their own chaperone as one is  not provided in our facility. The patient also has the right and is explained the right to defer or refuse any part of the evaluation or treatment including the internal exam. With the patient's consent, PT will use one gloved finger to gently assess the muscles of the pelvic floor, seeing how well it contracts and relaxes and if there is muscle symmetry. After, the patient  will get dressed and PT and patient will discuss exam findings and plan of care. PT and patient discuss plan of care, schedule, attendance policy and HEP activities.   PELVIC MMT:   MMT eval  Vaginal 3/5 (some weakness of pubovaginalis muscle; but tight and 4/5 coccygeus muscles); 20 sec hold; 10 reps  Internal Anal Sphincter   External Anal Sphincter   Puborectalis   Diastasis Recti   (Blank rows = not tested)        TONE: Mostly normal; higher in posterior pelvic floor and weaker vaginal closure  PROLAPSE: Posterior wall with valsalva descends to just above hymen remnants    TODAY'S TREATMENT   Date: 06/12/2022  Exercise: Supine  Pelvic tilts Pelvic tilts with brides 2x10 Pelvic tilts with bridges with abduction 2x12 Qped  Hip circles  Cat cow with diaphragmatic breathing UE reaches with 5# tactile cue LE reaches with 5# tactile cue   LE/UE reaches - 5x each Half kneel Resisted punches bil  Standing  Resisted punches bil at 90 degree angle from wall   PATIENT EDUCATION:  Education details: Stretches and new HEP Person educated: Patient Education method: Explanation and Handouts Education comprehension: verbalized understanding  Date: 05/29/22  Manual: Gluteal, thoracic and lumbar paraspinals Patient confirms identification and approves physical therapist to perform internal soft tissue work  Perineal body in various positions supine, sidelying, quadruped Levators bil STM lumbar paraspinals   Exercise: Hip abduction side lying - 15x bil Quadruped UE reaches; add LE/UE reaches - 10x each Standing against wall with core engagement while lifting a weighted ball  Wall squats with core engagement  - cues to keep back flat - 12 x Pallof press -  PATIENT EDUCATION:  Education details: moisturizing and perineal massage Person educated: Patient Education method: Explanation and Handouts Education comprehension: verbalized understanding   Date: 05/19/22   Manual: Gluteal, thoracic and lumbar paraspinals Patient confirms identification and approves physical therapist to perform internal soft tissue work  Perineal body in various positions supine, sidelying, quadruped Levators bil  PATIENT EDUCATION:  Education details: moisturizing and perineal massage Person educated: Patient Education method: Explanation and Handouts Education comprehension: verbalized understanding   HOME EXERCISE PROGRAM: Access Code: [email or web address] URL: [email or web address] Date: 06/12/2022 Prepared by: Jari Favre  Exercises - [place] Weight Shifts  - 1 x daily - 7 x weekly - 3 sets - 10 reps - Hooklying Small March  - 1 x daily - 7 x weekly - 2 sets - 10 reps - Happy Baby with Pelvic Floor Lengthening  - 1 x daily - 7 x weekly - 1 sets - 3 reps - 30 hold - Supine Butterfly Groin Stretch  - 1 x daily - 7 x weekly - 1 sets - 3 reps - 30 sec hold - Quadruped Cat Cow  - 1 x daily - 7 x weekly - 3 sets - 10 reps - Quadruped Alternating Arm Lift  - 1 x daily - 7 x weekly - 2 sets - 10 reps - Standing Anti-Rotation Press with Anchored Resistance  - 1 x daily - 7 x weekly -  3 sets - 10 reps - Anti-Rotation Sidestepping with Resistance  - 1 x daily - 7 x weekly - 3 sets - 10 reps - Child's Pose Stretch  - 1 x daily - 7 x weekly - 1 sets - 3 reps - 30-60 hold - Seated Hip Hinge with Dowel  - 1 x daily - 7 x weekly - 3 sets - 10 reps - Modified Single-Leg Deadlift  - 1 x daily - 7 x weekly - 3 sets - 10 reps  ASSESSMENT:  CLINICAL IMPRESSION: Pt hasn't had intercourse. Pt tolerated today's session well. Today session focused on posture, core strengthening, and stretching for lower back . Patient demonstrated difficulty with maintain a pelvic tilt with exercise and required moderate tactile and verbal cues. Patient reported feeling better after the stretches and had less pain in her back. She would benefit form continued skilled therapy to  continue to address strength and functional deficits.    OBJECTIVE IMPAIRMENTS decreased strength, increased fascial restrictions, increased muscle spasms, impaired flexibility, impaired tone, and pain.   ACTIVITY LIMITATIONS     PARTICIPATION LIMITATIONS: interpersonal relationship  PERSONAL FACTORS 1-2 comorbidities: 2 vaginal deliveries both causing tears in different places  are also affecting patient's functional outcome.   REHAB POTENTIAL: Excellent  CLINICAL DECISION MAKING: Stable/uncomplicated  EVALUATION COMPLEXITY: Low   GOALS: Goals reviewed with patient? Yes  SHORT TERM GOALS: Target date: 05/22/2022 Updated 05/19/22  Ind with perineal massage Baseline: Goal status: MET  2.  Ind with initial HEP Baseline:  Goal status: MET    LONG TERM GOALS: Target date: 07/17/2022   Pt will be independent with advanced HEP to maintain improvements made throughout therapy  Baseline:  Goal status: IN PROGRESS  2.  Pt will report 80% reduction of pain due to improvements in posture, strength, and muscle length  Baseline:  Goal status: IN PROGRESS  3.  Pt will have 0/3 score of [name] scale  Baseline: 1-2/3 Goal status: IN PROGRESS  4.  Pt will demonstrate 5/5 Lt hip abduction for improved posture in single leg stance Baseline:  Goal status: IN PROGRESS  5.  PFIQ reduced to 0 Baseline: 10 Goal status: IN PROGRESS   PLAN: PT FREQUENCY: 1x/week  PT DURATION: 12 weeks  PLANNED INTERVENTIONS: Therapeutic exercises, Therapeutic activity, Neuromuscular re-education, Balance training, Gait training, Patient/Family education, Self Care, Joint mobilization, Dry Needling, Electrical stimulation, Cryotherapy, Moist heat, Taping, Biofeedback, Manual therapy, and Re-evaluation  PLAN FOR NEXT SESSION: continue with core and glute med strength in standing    Rhian Asebedo, Student-PT 06/12/2022, 11:08 AM  I agree with the following treatment note after reviewing  documentation. This session was performed under the supervision of a licensed clinician.   Janey Genta DeSenglau, PT 06/12/22 11:22 AM  PHYSICAL THERAPY DISCHARGE SUMMARY  Visits from Start of Care: 5  Current functional level related to goals / functional outcomes: See above goals   Remaining deficits: See above   Education / Equipment: HEP   Patient agrees to discharge. Patient goals were not met. Patient is being discharged due to not returning since the last visit.  Gustavus Bryant, PT 09/09/22 12:37 PM

## 2022-06-19 ENCOUNTER — Ambulatory Visit: Payer: Medicaid Other | Admitting: Physical Therapy

## 2022-06-26 ENCOUNTER — Ambulatory Visit: Payer: Medicaid Other | Admitting: Physical Therapy

## 2022-09-16 ENCOUNTER — Ambulatory Visit (INDEPENDENT_AMBULATORY_CARE_PROVIDER_SITE_OTHER): Payer: Medicaid Other | Admitting: Lactation Services

## 2022-09-16 DIAGNOSIS — Z9189 Other specified personal risk factors, not elsewhere classified: Secondary | ICD-10-CM | POA: Diagnosis not present

## 2022-09-16 DIAGNOSIS — Z7189 Other specified counseling: Secondary | ICD-10-CM | POA: Diagnosis not present

## 2022-09-16 NOTE — Patient Instructions (Addendum)
Change to # 27 flanges for pumping for 24-48 hours Decrease flange size back down to # 20 flanges after some nipple healing Apply thin layer All Purpose Nipple Ointment to nipples after each pumping for 5-7 days Follow Thrush Protocol for mom as below:  Keep up the good work Thank you for allowing me to assist you today Please call with any questions or concerns as needed [phone]  - After each nursing/pumping session, rinse breasts with water or vinegar water (1 tablespoon white vinegar to 1 cup of water). Must use clean cotton ball with each application & change water daily - Apply antifungal cream to nipples/areola after rinsing as prescribed or recommended by healthcare provider - Air dry nipples - Avoid bra pads or change frequently if they are needed. Cotton bra pads are preferable  - Wash bra, towels, & cotton bra pads in hot soapy water each day. - May take mild pain medicine like Ibuprofen as recommended by healthcare provider - If too sore to nurse, mother may express milk/pump and feed baby with cup, spoon, or bottle.  - Avoid eating sweets, yeast foods, & dairy products. Increase dietary acidophilus (probiotic) tablets, garlic, zinc, & B vitamins per instructions on package.  ** Do not freeze milk to be used at a later time since freezing does not kill this fungal organism.**  Instructions for Baby: - After each feeding, wipe or rinse mouth with a small amount of water - Apply antifungal as prescribed by healthcare provider after rinsing mouth. If using gentian violet, ask for specific instructions. With Nystatin, pour measured amount in small cup and use Q-tips to swab baby's tongue/mouth to avoid contamination to antifungal container. Baby may drink the rest.  - For diaper infection: Wipe bottom with soapy water, rinse, dry & apply antifungal cream as prescribed  Instructions for Mother & Baby: - Careful handwashing before and after nursing/pumping, using the bathroom, or  changing diapers. Use paper towels to dry. - Boil items that come in contact with milk or infected areas such as breast shells, bottles, nipples, pump parts, pacifiers, etc for 20 minutes each day. - Continue treatment for at least 2 weeks and at least 1-2 weeks after symptoms are gone. If symptoms do not improve within 5 days, notify the healthcare provider

## 2022-09-16 NOTE — Progress Notes (Signed)
I was present at [place] for [employer] at [employer] [facility] and available to the lactation consultant during the patient's visit. I did not see the patient today, however I agree with the plan of care.   Caren Macadam, MD 09/16/2022 12:14 PM

## 2022-09-16 NOTE — Progress Notes (Signed)
Patient presents with excoriated, reddened nipples with tears at the base to both nipples x 3 weeks. Patient with perfect red circles around the areola adjacent to the nipple, indicating possible pump trauma. Nipples are itching and burning. No burning, shooting pains in the breasts. She reports the pain has been so bad she cries with pumping. She is exclusively pumping using a Spectra S1 with # 24 flanges ( she used the # 24 flanges with her daughter exclusively with no issues). She reports she applied Earth Mama Nipple Butter to nipples last night with breast pads and felt a little better today.   Mom pumps about 5 times a day on infants feeding schedule, she usually has enough breast milk to feed infant with occasional formula. She is pumping 3-6 ounces per pumping session. We reviewed pump settings and flange size. She has very elastic nipples and nipple areola is pulling about 1.5 inches into the barrel of the flange. Pumped with # 20 flanges in the office and nipples still bery elastic and pulling > 1 inch into the barrel. Reviewed she may need to decrease flange size again. Due to pain, advised to increase pump flange size to # 27 for 24-48 hours after using APNO and then decreasing flange size back down. She is using coconut oil with pumping since pain started and has decreased her suction form 10-7. Reviewed Buea Gen flanges and Pumpin Pals Flanges as alternatives if needed. Reviewed elasticity could have increased after birth of 2nd infant.   Patient reports at times she has pumped and only rinsed the pump flanges vs washing them.   Reviewed using APNO for 5-7 days to help with healing the nipple and treating any secondary infections. APNO without Ibuprofen called into [facility]. Patient informed. Reviewed treatment of yeast in mom and handout given. Patient advised to call the office if not improving in the next 5-7 days.

## 2022-09-21 ENCOUNTER — Encounter: Payer: Self-pay | Admitting: *Deleted

## 2023-09-08 NOTE — L&D Delivery Note (Addendum)
 OB/GYN Faculty Practice Delivery Note  Lauren Jensen is a 33 y.o. [id number] s/p SVB at [redacted]w[redacted]d. She was admitted for SROM.   ROM: 9h 98m with clear fluid GBS Status: positive Maximum Maternal Temperature: 98.7  Labor Progress: Admitted with SROM, progressed to complete in tub and spontaneous urge to push.   Delivery Date/Time: 07/22/2024 @ 1121 Delivery: Called to room and patient was complete with desire to push in tub. Head delivered OA to ROA and delay of anterior shoulder noted, tight nuchal present. Patient asked to lunge forward with right leg, posterior axilla grasped and birth of body followed.  Tight nuchal cord present somersaulted for delivery and reduced under water. Infant with spontaneous cry, placed on mother's abdomen, dried and stimulated. Cord clamped x 2 after 4-minute delay, and cut by FOB. Cord blood drawn. Placenta delivered spontaneously, intact, with 3-vessel cord. Fundus firm with massage and Pitocin . Labia, perineum, vagina, and cervix inspected, periurethral, left labial and 2nd degree lacerations identified and Dr. Ozan called to bedside for periurethral laceration. Dr. Ozan completed above repair with 3.0 vicryl, in the usual fashion, excellent hemostasis and approximation noted.   Placenta: Spontaneous, intact, Shultz (with trailing membranes) to L&D Complications: none Lacerations: Labia, perineum, vagina, and cervix inspected, periurethral, left labial and 2nd degree lacerations identified and Dr. Ozan called to bedside for periurethral laceration. Dr. Ozan completed above repair with 3.0 vicryl, in the usual fashion, excellent hemostasis and approximation noted.  EBL: 100 Analgesia: lidocaine  for repair only.   Postpartum Planning [email or web address].GANT ] transfer orders to MB GALERIUS.GANT ] discharge summary started & shared GALERIUS.GANT ] message to sent to schedule follow-up  [ X] lists updated  Infant: boy [name] 8/9  pending  Camie Rote, MSN, CNM, RNC-OB Certified Nurse Midwife,  [facility] 07/22/2024 12:00 PM

## 2023-12-09 ENCOUNTER — Ambulatory Visit (INDEPENDENT_AMBULATORY_CARE_PROVIDER_SITE_OTHER): Admitting: General Practice

## 2023-12-09 DIAGNOSIS — Z3201 Encounter for pregnancy test, result positive: Secondary | ICD-10-CM

## 2023-12-09 DIAGNOSIS — Z3A01 Less than 8 weeks gestation of pregnancy: Secondary | ICD-10-CM

## 2023-12-09 LAB — POCT PREGNANCY, URINE: Preg Test, Ur: POSITIVE — AB

## 2023-12-09 NOTE — Progress Notes (Signed)
 Patient came by office and dropped off urine sample for UPT. UPT +.  Called patient and informed her of + upt in office. She reports first positive home test 3/20. LMP 10/19/23 EDD 07/25/24 [redacted]w[redacted]d. Allergies/meds reviewed. Scheduled for dating/viability scan 4/15. Patient will return to office for new OB intake & new OB appt as well- desires waterbirth.   Chase Caller RN BSN 12/09/23

## 2023-12-21 ENCOUNTER — Other Ambulatory Visit: Payer: Self-pay

## 2023-12-21 ENCOUNTER — Ambulatory Visit: Payer: Self-pay

## 2023-12-21 DIAGNOSIS — Z3A01 Less than 8 weeks gestation of pregnancy: Secondary | ICD-10-CM

## 2023-12-21 DIAGNOSIS — Z3A09 9 weeks gestation of pregnancy: Secondary | ICD-10-CM | POA: Diagnosis not present

## 2023-12-21 DIAGNOSIS — Z3687 Encounter for antenatal screening for uncertain dates: Secondary | ICD-10-CM | POA: Diagnosis not present

## 2023-12-27 ENCOUNTER — Encounter: Payer: Self-pay | Admitting: Obstetrics and Gynecology

## 2023-12-27 ENCOUNTER — Other Ambulatory Visit: Payer: Self-pay | Admitting: Obstetrics and Gynecology

## 2024-01-04 ENCOUNTER — Telehealth

## 2024-01-11 ENCOUNTER — Other Ambulatory Visit (HOSPITAL_COMMUNITY)
Admission: RE | Admit: 2024-01-11 | Discharge: 2024-01-11 | Disposition: A | Discharge status: Home / Self Care | Source: Ambulatory Visit | Attending: Obstetrics and Gynecology | Admitting: Obstetrics and Gynecology

## 2024-01-11 ENCOUNTER — Encounter: Payer: Self-pay | Admitting: Certified Nurse Midwife

## 2024-01-11 ENCOUNTER — Ambulatory Visit (INDEPENDENT_AMBULATORY_CARE_PROVIDER_SITE_OTHER): Admitting: Obstetrics and Gynecology

## 2024-01-11 ENCOUNTER — Encounter: Payer: Self-pay | Admitting: Obstetrics and Gynecology

## 2024-01-11 VITALS — BP 115/73 | HR 73 | Wt 202.0 lb

## 2024-01-11 DIAGNOSIS — Z3480 Encounter for supervision of other normal pregnancy, unspecified trimester: Secondary | ICD-10-CM

## 2024-01-11 DIAGNOSIS — B009 Herpesviral infection, unspecified: Secondary | ICD-10-CM | POA: Diagnosis not present

## 2024-01-11 DIAGNOSIS — O26899 Other specified pregnancy related conditions, unspecified trimester: Secondary | ICD-10-CM

## 2024-01-11 DIAGNOSIS — Z6791 Unspecified blood type, Rh negative: Secondary | ICD-10-CM

## 2024-01-11 DIAGNOSIS — F411 Generalized anxiety disorder: Secondary | ICD-10-CM

## 2024-01-11 MED ORDER — VALACYCLOVIR HCL 500 MG PO TABS
500.0000 mg | ORAL_TABLET | Freq: Two times a day (BID) | ORAL | 1 refills | Status: AC
  Filled 2024-02-23: qty 180, 90d supply, fill #0
  Filled 2024-07-20: qty 180, 90d supply, fill #1

## 2024-01-11 NOTE — Progress Notes (Signed)
 INITIAL PRENATAL VISIT  History:   Lauren Jensen is a 33 y.o. [id number] at [redacted]w[redacted]d by LMP being seen today for her first obstetrical visit.  Her obstetrical history is significant for obesity. Patient does intend to breast feed. Pregnancy history fully reviewed.  Patient reports no complaints.  Successful WB with last pregnancy.   HISTORY: OB History  Gravida Para Term Preterm AB Living  4 2 2  0 1 2  SAB IAB Ectopic Multiple Live Births  0 1 0 0 2    # Outcome Date GA Lbr Len/2nd Weight Sex Type Anes PTL Lv  4 Current           3 Term 02/06/22 [redacted]w[redacted]d 05:51 / 00:06 8 lb 11.7 oz (3.96 kg) M Vag-Spont None  LIV     Name: Brubacher,BOY Gabriana     Apgar1: 9  Apgar5: 9  2 Term 03/13/20 [redacted]w[redacted]d  8 lb 2.9 oz (3.711 kg) F Vag-Spont  N LIV     Birth Comments: induced for postdates  1 IAB 2013            Last pap smear was done 11/2020 and was normal  Past Medical History:  Diagnosis Date   Allergy    Anemia    Anxiety    Depression    Heart murmur    HSV-2 infection    Past Surgical History:  Procedure Laterality Date   NO PAST SURGERIES     Family History  Problem Relation Age of Onset   Asthma Mother    Allergies Mother    Mental illness Father    Diabetes Father    Atrial fibrillation Father    Mental illness Sister    Mental illness Brother    Breast cancer Maternal Grandmother    Allergies Maternal Grandfather    Colon cancer Paternal Grandfather    Social History   Tobacco Use   Smoking status: Never   Smokeless tobacco: Never  Vaping Use   Vaping status: Never Used  Substance Use Topics   Alcohol use: Not Currently    Comment: not weekly   Drug use: Never   Allergies  Allergen Reactions   Penicillins Hives   Current Outpatient Medications on File Prior to Visit  Medication Sig Dispense Refill   cetirizine  (ZYRTEC ) 10 MG tablet Take 10 mg by mouth daily.     prenatal vitamin w/FE, FA (PRENATAL 1 + 1) 27-1 MG TABS tablet Take 1 tablet by mouth daily at 12  noon.     No current facility-administered medications on file prior to visit.    Review of Systems Pertinent items noted in HPI and remainder of comprehensive ROS otherwise negative.  Indications for ASA therapy (per UpToDate) One of the following: (Needs 162 mg daily) Previous pregnancy with preeclampsia, especially early onset and with an adverse outcome No Chronic hypertension No Type 1 or 2 diabetes mellitus No Multifetal gestation No Chronic kidney disease No Autoimmune disease (antiphospholipid syndrome, systemic lupus erythematosus) No Two or more of the following: (Can do 81 mg daily) Nulliparity No Obesity (body mass index >30 kg/m2) Yes Family history of preeclampsia in mother or sister No Age >=35 years No Sociodemographic characteristics (African American race, low socioeconomic level) No Personal risk factors (eg, previous pregnancy with low birth weight or small for gestational age infant, previous adverse pregnancy outcome [eg, stillbirth], interval >10 years between pregnancies) No In vitro conception No  Physical Exam:   Vitals:   01/11/24 1002  BP:  115/73  Pulse: 73  Weight: 202 lb (91.6 kg)   Fetal heart rate 147 bpm General: well-developed, well-nourished female in no acute distress  Breasts:  deferred  Skin: normal coloration and turgor, no rashes  Neurologic: oriented, normal, negative, normal mood  Extremities: normal strength, tone, and muscle mass, ROM of all joints is normal  HEENT PERRLA, extraocular movement intact and sclera clear, anicteric  Neck supple and no masses  Cardiovascular: regular rate and rhythm  Respiratory:  no respiratory distress, normal breath sounds  Abdomen: soft, non-tender; bowel sounds normal; no masses,  no organomegaly  Pelvic: deferred    Assessment:    Pregnancy: [id number] Patient Active Problem List   Diagnosis Date Noted   Encounter for supervision of normal pregnancy, antepartum 02/06/2022   Rh negative  state in antepartum period 09/17/2021   HSV-2 infection 08/20/2021   GAD (generalized anxiety disorder) 08/16/2018   Seasonal allergies 08/16/2018     Plan:    1. Supervision of other normal pregnancy, antepartum (Primary) -desires waterbirth, doesn't need repeat class. Had successful waterbirth last pregnancy. Will schedule CNM visit to discuss -considering BTL - CBC/D/Plt+RPR+Rh+ABO+RubIgG... - Culture, OB Urine - Cervicovaginal ancillary only - PANORAMA PRENATAL TEST - Hemoglobin A1c  2. Rh negative state in antepartum period -rhogam at 28 weeks  3. HSV-2 infection -reports 3-4 outbreaks with her last pregnancy and is requesting suppressive therapy throughout this pregnancy.  4. GAD (generalized anxiety disorder) -managing well currently, no meds. Feels like it's more of a challenge postpartum. Will reach out with any concerns   Initial labs drawn. Continue prenatal vitamins. Problem list reviewed and updated. Genetic Screening discussed, Panorama: requested. Ultrasound discussed; fetal anatomic survey: scheduled. Anticipatory guidance about prenatal visits given including labs, ultrasounds, and testing. Weight gain recommendations per IOM guidelines reviewed: underweight/BMI 18.5 or less > 28 - 40 lbs; normal weight/BMI 18.5 - 24.9 > 25 - 35 lbs; overweight/BMI 25 - 29.9 > 15 - 25 lbs; obese/BMI 30 or more > 11 - 20 lbs. Discussed usage of the Babyscripts app for more information about pregnancy, and to track blood pressures. Also discussed usage of virtual visits as additional source of managing and completing prenatal visits.  Patient was encouraged to use MyChart to review results, send requests, and have questions addressed.   The nature of Center for [facility]/Faculty Practice with multiple MDs and Advanced Practice Providers was explained to patient; also emphasized that residents, students are part of our team. Routine obstetric precautions reviewed.  Encouraged to seek out care at our office or emergency room [facility] preferred) for urgent and/or emergent concerns. No follow-ups on file.     Elora Hales, NP 01/11/2024 11:18 AM [facility]

## 2024-01-12 LAB — GC/CHLAMYDIA PROBE AMP (~~LOC~~) NOT AT ARMC
Chlamydia: NEGATIVE
Comment: NEGATIVE
Comment: NORMAL
Neisseria Gonorrhea: NEGATIVE

## 2024-01-12 LAB — CBC/D/PLT+RPR+RH+ABO+RUBIGG...
Antibody Screen: NEGATIVE
Basophils Absolute: 0 10*3/uL (ref 0.0–0.2)
Basos: 0 %
EOS (ABSOLUTE): 0.1 10*3/uL (ref 0.0–0.4)
Eos: 1 %
HCV Ab: NONREACTIVE
HIV Screen 4th Generation wRfx: NONREACTIVE
Hematocrit: 36.2 % (ref 34.0–46.6)
Hemoglobin: 12 g/dL (ref 11.1–15.9)
Hepatitis B Surface Ag: NEGATIVE
Immature Grans (Abs): 0 10*3/uL (ref 0.0–0.1)
Immature Granulocytes: 0 %
Lymphocytes Absolute: 2.1 10*3/uL (ref 0.7–3.1)
Lymphs: 25 %
MCH: 30 pg (ref 26.6–33.0)
MCHC: 33.1 g/dL (ref 31.5–35.7)
MCV: 91 fL (ref 79–97)
Monocytes Absolute: 0.5 10*3/uL (ref 0.1–0.9)
Monocytes: 6 %
Neutrophils Absolute: 5.9 10*3/uL (ref 1.4–7.0)
Neutrophils: 68 %
Platelets: 264 10*3/uL (ref 150–450)
RBC: 4 x10E6/uL (ref 3.77–5.28)
RDW: 13 % (ref 11.7–15.4)
RPR Ser Ql: NONREACTIVE
Rh Factor: NEGATIVE
Rubella Antibodies, IGG: 12 {index} (ref >0.99)
WBC: 8.6 10*3/uL (ref 3.4–10.8)

## 2024-01-12 LAB — HCV INTERPRETATION

## 2024-01-12 LAB — HEMOGLOBIN A1C
Est. average glucose Bld gHb Est-mCnc: 114 mg/dL
Hgb A1c MFr Bld: 5.6 % (ref 4.8–5.6)

## 2024-01-13 LAB — CULTURE, OB URINE

## 2024-01-13 LAB — URINE CULTURE, OB REFLEX

## 2024-01-14 DIAGNOSIS — Z3482 Encounter for supervision of other normal pregnancy, second trimester: Secondary | ICD-10-CM | POA: Diagnosis not present

## 2024-01-14 DIAGNOSIS — Z3480 Encounter for supervision of other normal pregnancy, unspecified trimester: Secondary | ICD-10-CM | POA: Diagnosis not present

## 2024-01-19 LAB — PANORAMA PRENATAL TEST FULL PANEL:PANORAMA TEST PLUS 5 ADDITIONAL MICRODELETIONS

## 2024-02-08 ENCOUNTER — Ambulatory Visit (INDEPENDENT_AMBULATORY_CARE_PROVIDER_SITE_OTHER): Admitting: Obstetrics and Gynecology

## 2024-02-08 VITALS — BP 111/69 | HR 86 | Wt 203.0 lb

## 2024-02-08 DIAGNOSIS — Z3400 Encounter for supervision of normal first pregnancy, unspecified trimester: Secondary | ICD-10-CM | POA: Diagnosis not present

## 2024-02-08 DIAGNOSIS — O26899 Other specified pregnancy related conditions, unspecified trimester: Secondary | ICD-10-CM

## 2024-02-08 DIAGNOSIS — F411 Generalized anxiety disorder: Secondary | ICD-10-CM

## 2024-02-08 DIAGNOSIS — B009 Herpesviral infection, unspecified: Secondary | ICD-10-CM

## 2024-02-08 DIAGNOSIS — Z6791 Unspecified blood type, Rh negative: Secondary | ICD-10-CM

## 2024-02-08 NOTE — Progress Notes (Signed)
   PRENATAL VISIT NOTE  Subjective:  Lauren Jensen is a 33 y.o. [id number] at [redacted]w[redacted]d being seen today for ongoing prenatal care.  She is currently monitored for the following issues for this low-risk pregnancy and has GAD (generalized anxiety disorder); Seasonal allergies; HSV-2 infection; Rh negative state in antepartum period; and Encounter for supervision of normal pregnancy, antepartum on their problem list.  Patient reports no complaints.  Contractions: Not present. Vag. Bleeding: None.  Movement: Present. Denies leaking of fluid.   The following portions of the patient's history were reviewed and updated as appropriate: allergies, current medications, past family history, past medical history, past social history, past surgical history and problem list.   Objective:    Vitals:   02/08/24 1004  BP: 111/69  Pulse: 86  Weight: 203 lb (92.1 kg)    Fetal Status:  Fetal Heart Rate (bpm): 145   Movement: Present    General: Alert, oriented and cooperative. Patient is in no acute distress.  Skin: Skin is warm and dry. No rash noted.   Cardiovascular: Normal heart rate noted  Respiratory: Normal respiratory effort, no problems with respiration noted  Abdomen: Soft, gravid, appropriate for gestational age.  Pain/Pressure: Present     Pelvic: Cervical exam deferred        Extremities: Normal range of motion.  Edema: None  Mental Status: Normal mood and affect. Normal behavior. Normal judgment and thought content.   Assessment and Plan:  Pregnancy: [id number] at [redacted]w[redacted]d  1. GAD (generalized anxiety disorder) (Primary)  Trouble with partner/relationship Luna joy information given along with other sources locally  2. Rh negative state in antepartum period  Rhogam @ 28w  3. Supervision of normal first pregnancy, antepartum  Bp normal today Overall doing well  Interested in [facility] Has taken class with last pregnancy Planning CNM visit   4. HSV-2 infection  On chronic suppression due to  frequent HSV outbreaks.   Preterm labor symptoms and general obstetric precautions including but not limited to vaginal bleeding, contractions, leaking of fluid and fetal movement were reviewed in detail with the patient. Please refer to After Visit Summary for other counseling recommendations.   Return Please schedule with CNM to discuss WB.Aaron Aas  Future Appointments  Date Time Provider Department Center  03/03/2024  1:00 PM [facility] PROVIDER 1 [facility]  03/03/2024  1:30 PM [facility] US5 [facility]  03/07/2024  9:50 AM Shonique Pelphrey, Juliette Oh, NP [facility]    Almond Jaffe, NP

## 2024-02-08 NOTE — Patient Instructions (Signed)
 Lauren Jensen is a virtual mental health platform available to our patients   We can refer you to a local mental health provider or you can refer yourself to this online platform using the link below  [email or web address]    [facility] in [place].

## 2024-02-21 DIAGNOSIS — O99210 Obesity complicating pregnancy, unspecified trimester: Secondary | ICD-10-CM | POA: Insufficient documentation

## 2024-02-23 ENCOUNTER — Other Ambulatory Visit (HOSPITAL_COMMUNITY): Payer: Self-pay

## 2024-02-23 ENCOUNTER — Other Ambulatory Visit: Payer: Self-pay

## 2024-03-03 ENCOUNTER — Ambulatory Visit (HOSPITAL_BASED_OUTPATIENT_CLINIC_OR_DEPARTMENT_OTHER): Admitting: Obstetrics

## 2024-03-03 ENCOUNTER — Other Ambulatory Visit: Payer: Self-pay | Admitting: *Deleted

## 2024-03-03 ENCOUNTER — Ambulatory Visit: Attending: Obstetrics and Gynecology

## 2024-03-03 VITALS — BP 113/56 | HR 70

## 2024-03-03 DIAGNOSIS — O43192 Other malformation of placenta, second trimester: Secondary | ICD-10-CM | POA: Diagnosis not present

## 2024-03-03 DIAGNOSIS — O99213 Obesity complicating pregnancy, third trimester: Secondary | ICD-10-CM | POA: Diagnosis not present

## 2024-03-03 DIAGNOSIS — O360190 Maternal care for anti-D [Rh] antibodies, unspecified trimester, not applicable or unspecified: Secondary | ICD-10-CM | POA: Insufficient documentation

## 2024-03-03 DIAGNOSIS — O43112 Circumvallate placenta, second trimester: Secondary | ICD-10-CM

## 2024-03-03 DIAGNOSIS — E669 Obesity, unspecified: Secondary | ICD-10-CM

## 2024-03-03 DIAGNOSIS — Z3A19 19 weeks gestation of pregnancy: Secondary | ICD-10-CM

## 2024-03-03 DIAGNOSIS — O99212 Obesity complicating pregnancy, second trimester: Secondary | ICD-10-CM | POA: Diagnosis not present

## 2024-03-03 DIAGNOSIS — Z3480 Encounter for supervision of other normal pregnancy, unspecified trimester: Secondary | ICD-10-CM | POA: Diagnosis present

## 2024-03-03 DIAGNOSIS — O99210 Obesity complicating pregnancy, unspecified trimester: Secondary | ICD-10-CM | POA: Insufficient documentation

## 2024-03-03 LAB — PANORAMA PRENATAL TEST FULL PANEL:PANORAMA TEST PLUS 5 ADDITIONAL MICRODELETIONS: FETAL FRACTION: 6.6

## 2024-03-03 NOTE — Progress Notes (Signed)
 MFM Consult Note  Lauren Jensen is currently at 19 weeks and 3 days.  She was seen for detailed fetal anatomy scan due to maternal obesity with a BMI of 32.4.  She denies any significant past medical history and denies any problems in her current pregnancy.    Her blood type is A negative.  She had a cell free DNA test earlier in her pregnancy which indicated a low risk for trisomy 13, 9, and 13. A female fetus is predicted.   She was informed that the fetal growth and amniotic fluid level were appropriate for her gestational age.   There were no obvious fetal anomalies noted on today's ultrasound exam.  However, today's exam was limited due to the fetal position.  The patient was informed that anomalies may be missed due to technical limitations. If the fetus is in a suboptimal position or maternal habitus is increased, visualization of the fetus in the maternal uterus may be impaired.  As her blood type is Rh-, our genetic counselor contacted the lab [facility] for them to review the fetal Rh status.    Should her fetus be Rh-, she will not require shots of RhoGAM.    Should her fetus be Rh+, she will require shots of RhoGAM for the usual indications.    A follow-up exam was scheduled in 5 weeks assess the fetal growth and to reassess the views of the fetal anatomy.    The patient stated that all of her questions were answered today.  A total of 30 minutes was spent counseling and coordinating the care for this patient.  Greater than 50% of the time was spent in direct face-to-face contact.

## 2024-03-07 ENCOUNTER — Telehealth: Payer: Self-pay | Admitting: *Deleted

## 2024-03-07 ENCOUNTER — Encounter: Admitting: Obstetrics and Gynecology

## 2024-03-07 NOTE — Telephone Encounter (Signed)
 Left patient an urgent message to keep 1:00 PM appointment. Had to fill 10:35 AM with an emergency OB.

## 2024-03-07 NOTE — Telephone Encounter (Signed)
 Left patient a message to see if she is able to come at 10:35 AM on 03/08/2024 to move out of overbook slot.

## 2024-03-08 ENCOUNTER — Telehealth: Payer: Self-pay

## 2024-03-08 ENCOUNTER — Ambulatory Visit (INDEPENDENT_AMBULATORY_CARE_PROVIDER_SITE_OTHER): Admitting: Obstetrics and Gynecology

## 2024-03-08 ENCOUNTER — Other Ambulatory Visit (HOSPITAL_COMMUNITY)
Admission: RE | Admit: 2024-03-08 | Discharge: 2024-03-08 | Disposition: A | Discharge status: Home / Self Care | Source: Ambulatory Visit | Attending: Obstetrics and Gynecology | Admitting: Obstetrics and Gynecology

## 2024-03-08 VITALS — BP 106/66 | HR 70 | Wt 204.1 lb

## 2024-03-08 DIAGNOSIS — F411 Generalized anxiety disorder: Secondary | ICD-10-CM

## 2024-03-08 DIAGNOSIS — Z6832 Body mass index (BMI) 32.0-32.9, adult: Secondary | ICD-10-CM

## 2024-03-08 DIAGNOSIS — Z1332 Encounter for screening for maternal depression: Secondary | ICD-10-CM | POA: Diagnosis not present

## 2024-03-08 DIAGNOSIS — N898 Other specified noninflammatory disorders of vagina: Secondary | ICD-10-CM | POA: Diagnosis not present

## 2024-03-08 DIAGNOSIS — O26899 Other specified pregnancy related conditions, unspecified trimester: Secondary | ICD-10-CM | POA: Diagnosis not present

## 2024-03-08 DIAGNOSIS — Z6791 Unspecified blood type, Rh negative: Secondary | ICD-10-CM

## 2024-03-08 DIAGNOSIS — B009 Herpesviral infection, unspecified: Secondary | ICD-10-CM

## 2024-03-08 DIAGNOSIS — Z3480 Encounter for supervision of other normal pregnancy, unspecified trimester: Secondary | ICD-10-CM

## 2024-03-08 DIAGNOSIS — Z3A20 20 weeks gestation of pregnancy: Secondary | ICD-10-CM | POA: Diagnosis not present

## 2024-03-08 DIAGNOSIS — Z3009 Encounter for other general counseling and advice on contraception: Secondary | ICD-10-CM

## 2024-03-08 NOTE — Patient Instructions (Signed)
 Avoid: - Synthetic underwear - Tight pants - Swim suits, thongs, leotards, leggings for prolonged periods of time - Scented soap/shampoo - Bubble baths - Scented detergents, dryer sheets - Baby wipes - Feminine sprays, douches, powders - Panty liners - Dyed toilet paper - Shaving  Trying swapping out the above for: - Cotton or no underwear - Loose pants, skirts, dresses - Changing out of swimwear, thongs, and workout gear as soon as you're done exercising - Fragrance free soaps (like Dove sensitive skin) - Warm plain water baths - Unscented laundry detergent - Use a bedet or peri bottle to rinse instead of baby wipes - Tampons, cotton pads, cotton period underwear - Undyed toilet paper - Clipping hair

## 2024-03-08 NOTE — Telephone Encounter (Signed)
 Spoke with the patient to inform her Panorama NIPS returned as RhD positive for the fetus. Rhogam should be administered at 28w.  Lauraine Bodily, MS, [facility] Certified Genetic Counselor [facility] [phone]

## 2024-03-08 NOTE — Progress Notes (Signed)
   PRENATAL VISIT NOTE  Subjective:  Lauren Jensen is a 33 y.o. [id number] at [redacted]w[redacted]d being seen today for ongoing prenatal care.  She is currently monitored for the following issues for this low-risk pregnancy and has GAD (generalized anxiety disorder); HSV-2 infection; Rh negative state in antepartum period; Encounter for supervision of normal pregnancy, antepartum; and Obesity affecting pregnancy, antepartum on their problem list.  Patient reports vulvar irritation/itching.  Contractions: Not present. Vag. Bleeding: None.  Movement: Present. Denies leaking of fluid.   The following portions of the patient's history were reviewed and updated as appropriate: allergies, current medications, past family history, past medical history, past social history, past surgical history and problem list.   Objective:   Vitals:   03/08/24 1309  BP: 106/66  Pulse: 70  Weight: 204 lb 1.9 oz (92.6 kg)    Fetal Status: Fetal Heart Rate (bpm): 151   Movement: Present     General:  Alert, oriented and cooperative. Patient is in no acute distress.  Skin: Skin is warm and dry. No rash noted.   Cardiovascular: Normal heart rate noted  Respiratory: Normal respiratory effort, no problems with respiration noted  Abdomen: Soft, gravid, appropriate for gestational age.  Pain/Pressure: Absent      Assessment and Plan:  Pregnancy: [id number] at [redacted]w[redacted]d 1. Supervision of other normal pregnancy, antepartum (Primary) 2. [redacted] weeks gestation of pregnancy Anatomy normal but incomplete, follow up scheduled 8/1 Interested in [facility], has CNM appt 7/11 Vulvar care reviewed, will r/o BV/yeast  3. Rh negative state in antepartum period NIPS w/ Rh+ fetus Rhogam at 28w  4. GAD (generalized anxiety disorder) IBH referral today, no current medications  5. HSV-2 infection Valtrex  suppression throughout pregnancy due to frequent outbreaks  6. BMI 32.0-32.9,adult  7. Consultation for female sterilization Pt no longer considering.  Discussed LARC options, she is considering nexplanon  Please refer to After Visit Summary for other counseling recommendations.   Return in about 4 weeks (around 04/05/2024) for return OB at 24 weeks.  Future Appointments  Date Time Provider Department Center  03/17/2024 10:50 AM Regino Camie DELENA EDDY [facility]  04/07/2024 11:00 AM [facility] PROVIDER 1 [facility]  04/07/2024 11:30 AM [facility] US5 [facility]    Kieth JAYSON Carolin, MD

## 2024-03-09 ENCOUNTER — Ambulatory Visit: Payer: Self-pay | Admitting: Obstetrics and Gynecology

## 2024-03-09 LAB — CERVICOVAGINAL ANCILLARY ONLY
Bacterial Vaginitis (gardnerella): POSITIVE — AB
Candida Glabrata: NEGATIVE
Candida Vaginitis: POSITIVE — AB
Comment: NEGATIVE
Comment: NEGATIVE
Comment: NEGATIVE

## 2024-03-09 MED ORDER — CLOTRIMAZOLE 1 % VA CREA
1.0000 | TOPICAL_CREAM | Freq: Every day | VAGINAL | 0 refills | Status: AC
  Filled 2024-03-09: qty 45, 7d supply, fill #0

## 2024-03-09 MED ORDER — METRONIDAZOLE 500 MG PO TABS
500.0000 mg | ORAL_TABLET | Freq: Two times a day (BID) | ORAL | 0 refills | Status: AC
  Filled 2024-03-09: qty 14, 7d supply, fill #0

## 2024-03-11 ENCOUNTER — Other Ambulatory Visit (HOSPITAL_COMMUNITY): Payer: Self-pay

## 2024-03-13 ENCOUNTER — Other Ambulatory Visit: Payer: Self-pay

## 2024-03-16 NOTE — Progress Notes (Signed)
 I connected with Lauren Jensen 03/17/24 at 10:50 AM EDT by: MyChart video and verified that I am speaking with the correct person using two identifiers.  Patient is located at home and provider is located at [place] location.     I discussed the limitations, risks, security and privacy concerns of performing an evaluation and management service by MyChart video and the availability of in person appointments. I also discussed with the patient that there may be a patient responsible charge related to this service. By engaging in this virtual visit, you consent to the provision of healthcare.  Additionally, you authorize for your insurance to be billed for the services provided during this visit.  The patient expressed understanding and agreed to proceed.  The following staff members participated in the virtual visit:  Camie Rote, CNM    PRENATAL VISIT NOTE  Subjective:  Lauren Jensen is a 33 y.o. [id number] at [redacted]w[redacted]d  for virtual visit for ongoing prenatal care.  She is currently monitored for the following issues for this low-risk pregnancy and has GAD (generalized anxiety disorder); HSV-2 infection; Rh negative state in antepartum period; Encounter for supervision of normal pregnancy, antepartum; and Obesity affecting pregnancy, antepartum on their problem list.  Patient reports no complaints.   .  .   . Denies leaking of fluid.   The following portions of the patient's history were reviewed and updated as appropriate: allergies, current medications, past family history, past medical history, past social history, past surgical history and problem list.   Objective:  There were no vitals filed for this visit. Self-Obtained  Fetal Status:           Assessment and Plan:  Pregnancy: [id number] at [redacted]w[redacted]d 1. Supervision of other normal pregnancy, antepartum (Primary) - Doing well, feeling regular and vigorous fetal movement  2. [redacted] weeks gestation of pregnancy - Routine virtual care.   3. Rh  negative state in antepartum period - Rhophylac  with 3T labs.   4. GAD (generalized anxiety disorder) - Coping well.   5. HSV-2 infection - Suppression at 36 weeks  - Pt interested in waterbirth and has attended the class with a past pregnancy. Additionally has had previous waterbirth.  - Reviewed conditions in labor that will risk her out of water immersion including thick meconium or blood stained amniotic fluid, non-reassuring fetal status on monitor, excessive bleeding, hypertension, dizziness, use of IV meds, damaged equipment or staffing that does not allow for water immersion, etc.  - The attending midwife must be on the unit for water immersion to begin; pt understands this may delay the start of water immersion. - Reminded pt that signing consent in labor at the hospital also acknowledges they will exit the tub if the attending midwife requests. - Consent given to patient for review.  Consent will be reviewed and signed at the hospital by the waterbirth provider prior to use of the tub. - Discussed other labor support options if waterbirth becomes unavailable, including position change, freedom of movement, use of birthing ball, and/or use of hydrotherapy in the shower (dependent upon medical condition/provider discretion).    Preterm labor symptoms and general obstetric precautions including but not limited to vaginal bleeding, contractions, leaking of fluid and fetal movement were reviewed in detail with the patient.  Return in about 4 weeks (around 04/14/2024) for LOB.  Future Appointments  Date Time Provider Department Center  04/06/2024 10:10 AM Cleatus Moccasin, MD [facility]  04/07/2024 11:00 AM [facility] PROVIDER 1 [facility]  04/07/2024  11:30 AM [facility] US5 [facility]     Time spent on virtual visit: 15 minutes  Camie DELENA Rote, CNM

## 2024-03-17 ENCOUNTER — Telehealth (INDEPENDENT_AMBULATORY_CARE_PROVIDER_SITE_OTHER): Admitting: Certified Nurse Midwife

## 2024-03-17 DIAGNOSIS — Z6791 Unspecified blood type, Rh negative: Secondary | ICD-10-CM

## 2024-03-17 DIAGNOSIS — O26899 Other specified pregnancy related conditions, unspecified trimester: Secondary | ICD-10-CM

## 2024-03-17 DIAGNOSIS — B009 Herpesviral infection, unspecified: Secondary | ICD-10-CM

## 2024-03-17 DIAGNOSIS — Z3A21 21 weeks gestation of pregnancy: Secondary | ICD-10-CM

## 2024-03-17 DIAGNOSIS — F411 Generalized anxiety disorder: Secondary | ICD-10-CM

## 2024-03-17 DIAGNOSIS — Z3480 Encounter for supervision of other normal pregnancy, unspecified trimester: Secondary | ICD-10-CM | POA: Diagnosis not present

## 2024-03-20 ENCOUNTER — Encounter: Payer: Self-pay | Admitting: Obstetrics and Gynecology

## 2024-03-28 ENCOUNTER — Encounter: Payer: Self-pay | Admitting: Licensed Clinical Social Worker

## 2024-03-30 ENCOUNTER — Encounter: Payer: Self-pay | Admitting: Obstetrics and Gynecology

## 2024-04-04 ENCOUNTER — Encounter: Payer: Self-pay | Admitting: Licensed Clinical Social Worker

## 2024-04-06 ENCOUNTER — Ambulatory Visit: Admitting: Obstetrics and Gynecology

## 2024-04-06 ENCOUNTER — Encounter: Payer: Self-pay | Admitting: Obstetrics and Gynecology

## 2024-04-06 VITALS — BP 115/72 | HR 88 | Wt 203.0 lb

## 2024-04-06 DIAGNOSIS — O98312 Other infections with a predominantly sexual mode of transmission complicating pregnancy, second trimester: Secondary | ICD-10-CM

## 2024-04-06 DIAGNOSIS — O99342 Other mental disorders complicating pregnancy, second trimester: Secondary | ICD-10-CM

## 2024-04-06 DIAGNOSIS — O26899 Other specified pregnancy related conditions, unspecified trimester: Secondary | ICD-10-CM

## 2024-04-06 DIAGNOSIS — O26892 Other specified pregnancy related conditions, second trimester: Secondary | ICD-10-CM

## 2024-04-06 DIAGNOSIS — Z6791 Unspecified blood type, Rh negative: Secondary | ICD-10-CM

## 2024-04-06 DIAGNOSIS — F411 Generalized anxiety disorder: Secondary | ICD-10-CM

## 2024-04-06 DIAGNOSIS — B009 Herpesviral infection, unspecified: Secondary | ICD-10-CM

## 2024-04-06 DIAGNOSIS — O99210 Obesity complicating pregnancy, unspecified trimester: Secondary | ICD-10-CM

## 2024-04-06 DIAGNOSIS — Z3A24 24 weeks gestation of pregnancy: Secondary | ICD-10-CM

## 2024-04-06 DIAGNOSIS — O99212 Obesity complicating pregnancy, second trimester: Secondary | ICD-10-CM | POA: Diagnosis not present

## 2024-04-06 DIAGNOSIS — Z3480 Encounter for supervision of other normal pregnancy, unspecified trimester: Secondary | ICD-10-CM

## 2024-04-06 NOTE — Progress Notes (Signed)
   PRENATAL VISIT NOTE  Subjective:  Lauren Jensen is a 33 y.o. H5E7987 at [redacted]w[redacted]d being seen today for ongoing prenatal care.  She is currently monitored for the following issues for this low-risk pregnancy and has GAD (generalized anxiety disorder); HSV-2 infection; Rh negative state in antepartum period; Encounter for supervision of normal pregnancy, antepartum; and Obesity affecting pregnancy, antepartum on their problem list.  Patient reports no complaints.  Contractions: Irritability. Vag. Bleeding: None.  Movement: Present. Denies leaking of fluid.   The following portions of the patient's history were reviewed and updated as appropriate: allergies, current medications, past family history, past medical history, past social history, past surgical history and problem list.   Objective:    Vitals:   04/06/24 1005  BP: 115/72  Pulse: 88  Weight: 203 lb (92.1 kg)    Fetal Status:  Fetal Heart Rate (bpm): 134   Movement: Present    General: Alert, oriented and cooperative. Patient is in no acute distress.  Skin: Skin is warm and dry. No rash noted.   Cardiovascular: Normal heart rate noted  Respiratory: Normal respiratory effort, no problems with respiration noted  Abdomen: Soft, gravid, appropriate for gestational age.  Pain/Pressure: Present     Pelvic: Cervical exam deferred        Extremities: Normal range of motion.  Edema: None  Mental Status: Normal mood and affect. Normal behavior. Normal judgment and thought content.   Assessment and Plan:  Pregnancy: [id number] at [redacted]w[redacted]d 1. Supervision of other normal pregnancy, antepartum (Primary) Routine PNC up to date.  28 wk labs next visit.   2. GAD (generalized anxiety disorder) She has appt next week with IBH with [facility].  She would be open to postpartum buspar or zoloft for her anxiety.  Some of her anxiety is situational: 2 other kids, trying to move in April 2026, she thinks her partner has undiagnosed bipolar d/o, etc.   3.  HSV-2 infection Prophy already prescribed. She is taking it daily currently. We discussed twice daily at 35 weeks.   4. Obesity affecting pregnancy, antepartum, unspecified obesity type Has growth tomorrow. Was normal at anatomy  5. Rh negative state in antepartum period Fetal RhD pos by NIPS.  Rhogam next visit.   6. Pregnancy with 24 completed weeks gestation   Preterm labor symptoms and general obstetric precautions including but not limited to vaginal bleeding, contractions, leaking of fluid and fetal movement were reviewed in detail with the patient. Please refer to After Visit Summary for other counseling recommendations.   Return in about 4 weeks (around 05/04/2024) for OB VISIT, MD or APP, 2 hr GTT/28w labs.  Future Appointments  Date Time Provider Department Center  04/07/2024 11:00 AM [facility] PROVIDER 1 [facility]  04/07/2024 11:30 AM [facility] US5 [facility]  05/04/2024 11:10 AM Constant, Winton, MD [facility]    Vina Solian, MD

## 2024-04-07 ENCOUNTER — Other Ambulatory Visit: Payer: Self-pay | Admitting: *Deleted

## 2024-04-07 ENCOUNTER — Ambulatory Visit: Attending: Obstetrics and Gynecology | Admitting: Obstetrics and Gynecology

## 2024-04-07 ENCOUNTER — Ambulatory Visit

## 2024-04-07 VITALS — BP 118/62 | HR 77

## 2024-04-07 DIAGNOSIS — O99212 Obesity complicating pregnancy, second trimester: Secondary | ICD-10-CM | POA: Insufficient documentation

## 2024-04-07 DIAGNOSIS — O360120 Maternal care for anti-D [Rh] antibodies, second trimester, not applicable or unspecified: Secondary | ICD-10-CM | POA: Insufficient documentation

## 2024-04-07 DIAGNOSIS — O99210 Obesity complicating pregnancy, unspecified trimester: Secondary | ICD-10-CM

## 2024-04-07 DIAGNOSIS — Z3A24 24 weeks gestation of pregnancy: Secondary | ICD-10-CM | POA: Insufficient documentation

## 2024-04-07 DIAGNOSIS — O43192 Other malformation of placenta, second trimester: Secondary | ICD-10-CM

## 2024-04-07 DIAGNOSIS — O43112 Circumvallate placenta, second trimester: Secondary | ICD-10-CM | POA: Insufficient documentation

## 2024-04-07 DIAGNOSIS — E669 Obesity, unspecified: Secondary | ICD-10-CM | POA: Diagnosis not present

## 2024-04-07 DIAGNOSIS — Z6711 Type A blood, Rh negative: Secondary | ICD-10-CM | POA: Diagnosis not present

## 2024-04-07 DIAGNOSIS — Z6791 Unspecified blood type, Rh negative: Secondary | ICD-10-CM

## 2024-04-07 NOTE — Progress Notes (Signed)
 Maternal-Fetal Medicine Consultation Name: Lauren Jensen MRN: [id number]  G4 E7987 at 24w 3d gestation.  Patient is here for fetal growth assessment.  Circumvallate placenta was diagnosed at previous ultrasound.  Ultrasound Fetal growth is appropriate for gestational age.  Amniotic fluid normal good fetal activity seen.  Circumvallate placenta is seen.  Circumvallate Placenta When the chorion inserts away from the placental margin inward at a variable distance from the edge, it is called circumvallate placenta. On ultrasound, a raised appearance of placental edge is seen. It is commonly seen in the mid-trimester scans and the incidence is very low in the third trimester (1% to 2%). Most pregnancies have good outcomes. If it persists in the third trimester, it can be associated with PPROM, preterm delivery, placental abruption, and fetal growth restriction in some cases. I reassured the patient that we should expect good outcomes in most pregnancies.  Patient's blood type is A negative.  Fetus is Rh positive (Panorama).  Patient will require RhoGAM injection at [redacted] weeks gestation.  Recommendations - An appointment was made for her to return in 8 weeks for fetal growth assessment and to evaluate the placenta    Consultation including face-to-face (more than 50%) counseling 10 minutes.

## 2024-05-04 ENCOUNTER — Ambulatory Visit (INDEPENDENT_AMBULATORY_CARE_PROVIDER_SITE_OTHER): Admitting: Obstetrics and Gynecology

## 2024-05-04 ENCOUNTER — Encounter: Payer: Self-pay | Admitting: Obstetrics and Gynecology

## 2024-05-04 VITALS — BP 105/69 | HR 80 | Wt 203.0 lb

## 2024-05-04 DIAGNOSIS — O26893 Other specified pregnancy related conditions, third trimester: Secondary | ICD-10-CM

## 2024-05-04 DIAGNOSIS — Z3A28 28 weeks gestation of pregnancy: Secondary | ICD-10-CM

## 2024-05-04 DIAGNOSIS — Z1331 Encounter for screening for depression: Secondary | ICD-10-CM

## 2024-05-04 DIAGNOSIS — Z6791 Unspecified blood type, Rh negative: Secondary | ICD-10-CM | POA: Diagnosis not present

## 2024-05-04 DIAGNOSIS — B009 Herpesviral infection, unspecified: Secondary | ICD-10-CM

## 2024-05-04 DIAGNOSIS — F411 Generalized anxiety disorder: Secondary | ICD-10-CM | POA: Diagnosis not present

## 2024-05-04 DIAGNOSIS — O99213 Obesity complicating pregnancy, third trimester: Secondary | ICD-10-CM | POA: Diagnosis not present

## 2024-05-04 DIAGNOSIS — Z3480 Encounter for supervision of other normal pregnancy, unspecified trimester: Secondary | ICD-10-CM

## 2024-05-04 DIAGNOSIS — O99210 Obesity complicating pregnancy, unspecified trimester: Secondary | ICD-10-CM

## 2024-05-04 MED ORDER — RHO D IMMUNE GLOBULIN 1500 UNIT/2ML IJ SOSY
300.0000 ug | PREFILLED_SYRINGE | Freq: Once | INTRAMUSCULAR | Status: AC
  Administered 2024-05-04: 300 ug via INTRAMUSCULAR

## 2024-05-04 NOTE — Addendum Note (Signed)
 Addended by: ORLINDA SILVANO ORN on: 05/04/2024 11:49 AM   Modules accepted: Orders

## 2024-05-04 NOTE — Progress Notes (Addendum)
   PRENATAL VISIT NOTE  Subjective:  Lauren Jensen is a 33 y.o. H5E7987 at [redacted]w[redacted]d being seen today for ongoing prenatal care.  She is currently monitored for the following issues for this high-risk pregnancy and has GAD (generalized anxiety disorder); HSV-2 infection; Rh negative state in antepartum period; Encounter for supervision of normal pregnancy, antepartum; and Obesity affecting pregnancy, antepartum on their problem list.  Patient reports no complaints.  Contractions: Irritability. Vag. Bleeding: None.  Movement: Present. Denies leaking of fluid.   The following portions of the patient's history were reviewed and updated as appropriate: allergies, current medications, past family history, past medical history, past social history, past surgical history and problem list.   Objective:    Vitals:   05/04/24 1112  BP: 105/69  Pulse: 80  Weight: 203 lb (92.1 kg)    Fetal Status:  Fetal Heart Rate (bpm): 139 Fundal Height: 28 cm Movement: Present    General: Alert, oriented and cooperative. Patient is in no acute distress.  Skin: Skin is warm and dry. No rash noted.   Cardiovascular: Normal heart rate noted  Respiratory: Normal respiratory effort, no problems with respiration noted  Abdomen: Soft, gravid, appropriate for gestational age.  Pain/Pressure: Absent     Pelvic: Cervical exam deferred        Extremities: Normal range of motion.  Edema: None  Mental Status: Normal mood and affect. Normal behavior. Normal judgment and thought content.   Assessment and Plan:  Pregnancy: [id number] at [redacted]w[redacted]d 1. Supervision of other normal pregnancy, antepartum (Primary) Patient is doing well without complaints Patient unable to stay for 2-hour glucola- scheduled to return on 9/3  2. Rh negative state in antepartum period Rhogam today  3. Obesity affecting pregnancy, antepartum, unspecified obesity type Follow up growth 9/26  4. HSV-2 infection Continue suppression  5. GAD (generalized  anxiety disorder) Stable without medications  6. [redacted] weeks gestation of pregnancy   Preterm labor symptoms and general obstetric precautions including but not limited to vaginal bleeding, contractions, leaking of fluid and fetal movement were reviewed in detail with the patient. Please refer to After Visit Summary for other counseling recommendations.   Return in about 2 weeks (around 05/18/2024) for in person, ROB, High risk.  Future Appointments  Date Time Provider Department Center  05/10/2024  9:00 AM [facility] NURSE [facility]  05/18/2024 10:30 AM Erik Kieth BROCKS, MD [facility]  06/02/2024 11:00 AM [facility] PROVIDER 1 [facility]  06/02/2024 11:30 AM [facility] US2 [facility]    Winton Felt, MD

## 2024-05-10 ENCOUNTER — Other Ambulatory Visit

## 2024-05-17 ENCOUNTER — Telehealth: Payer: Self-pay | Admitting: *Deleted

## 2024-05-17 DIAGNOSIS — Z3A28 28 weeks gestation of pregnancy: Secondary | ICD-10-CM | POA: Diagnosis not present

## 2024-05-17 DIAGNOSIS — Z3480 Encounter for supervision of other normal pregnancy, unspecified trimester: Secondary | ICD-10-CM | POA: Diagnosis not present

## 2024-05-17 NOTE — Telephone Encounter (Signed)
 Left patient a message to see if she can be moved out of an overbook to an open appointment.

## 2024-05-18 ENCOUNTER — Ambulatory Visit: Admitting: Obstetrics and Gynecology

## 2024-05-18 VITALS — BP 104/69 | HR 85 | Wt 207.0 lb

## 2024-05-18 DIAGNOSIS — N819 Female genital prolapse, unspecified: Secondary | ICD-10-CM

## 2024-05-18 DIAGNOSIS — Z3480 Encounter for supervision of other normal pregnancy, unspecified trimester: Secondary | ICD-10-CM

## 2024-05-18 DIAGNOSIS — O43113 Circumvallate placenta, third trimester: Secondary | ICD-10-CM | POA: Diagnosis not present

## 2024-05-18 DIAGNOSIS — O26893 Other specified pregnancy related conditions, third trimester: Secondary | ICD-10-CM

## 2024-05-18 DIAGNOSIS — Z1331 Encounter for screening for depression: Secondary | ICD-10-CM

## 2024-05-18 DIAGNOSIS — B009 Herpesviral infection, unspecified: Secondary | ICD-10-CM | POA: Diagnosis not present

## 2024-05-18 DIAGNOSIS — Z3A30 30 weeks gestation of pregnancy: Secondary | ICD-10-CM | POA: Diagnosis not present

## 2024-05-18 DIAGNOSIS — Z6791 Unspecified blood type, Rh negative: Secondary | ICD-10-CM | POA: Diagnosis not present

## 2024-05-18 DIAGNOSIS — O99343 Other mental disorders complicating pregnancy, third trimester: Secondary | ICD-10-CM

## 2024-05-18 DIAGNOSIS — F411 Generalized anxiety disorder: Secondary | ICD-10-CM

## 2024-05-18 LAB — GLUCOSE TOLERANCE, 2 HOURS W/ 1HR
Glucose, 1 hour: 155 mg/dL (ref 70–179)
Glucose, 2 hour: 117 mg/dL (ref 70–152)
Glucose, Fasting: 83 mg/dL (ref 70–91)

## 2024-05-18 LAB — CBC
Hematocrit: 35.7 % (ref 34.0–46.6)
Hemoglobin: 11.8 g/dL (ref 11.1–15.9)
MCH: 32.3 pg (ref 26.6–33.0)
MCHC: 33.1 g/dL (ref 31.5–35.7)
MCV: 98 fL — ABNORMAL HIGH (ref 79–97)
Platelets: 230 x10E3/uL (ref 150–450)
RBC: 3.65 x10E6/uL — ABNORMAL LOW (ref 3.77–5.28)
RDW: 13.3 % (ref 11.7–15.4)
WBC: 6.7 x10E3/uL (ref 3.4–10.8)

## 2024-05-18 LAB — HIV ANTIBODY (ROUTINE TESTING W REFLEX): HIV Screen 4th Generation wRfx: NONREACTIVE

## 2024-05-18 LAB — RPR: RPR Ser Ql: NONREACTIVE

## 2024-05-18 NOTE — Progress Notes (Signed)
   PRENATAL VISIT NOTE  Subjective:  Lauren Jensen is a 33 y.o. H5E7987 at [redacted]w[redacted]d being seen today for ongoing prenatal care.  She is currently monitored for the following issues for this low-risk pregnancy and has GAD (generalized anxiety disorder); HSV-2 infection; Rh negative state in antepartum period; Encounter for supervision of normal pregnancy, antepartum; and Obesity affecting pregnancy, antepartum on their problem list.  Patient reports 3 episodes of dizziness lasting 10 minutes in the past week. [place] and then yesterday several hours after 2h GTT while cooking. May have been dehydrated. Got better with drinking water and rest. No CP/palpitations. Also worried about prolapse, could see it when looking in the mirror, not past introitus but interested in PFPT (has done in the past). Contractions: Not present. Vag. Bleeding: None.  Movement: Present. Denies leaking of fluid.   The following portions of the patient's history were reviewed and updated as appropriate: allergies, current medications, past family history, past medical history, past social history, past surgical history and problem list.   Objective:   Vitals:   05/18/24 0940  BP: 104/69  Pulse: 85  Weight: 207 lb (93.9 kg)   Fetal Status: Fetal Heart Rate (bpm): 128   Movement: Present     General:  Alert, oriented and cooperative. Patient is in no acute distress.  Skin: Skin is warm and dry. No rash noted.   Cardiovascular: Normal heart rate noted  Respiratory: Normal respiratory effort, no problems with respiration noted  Abdomen: Soft, gravid, appropriate for gestational age.  Pain/Pressure: Present      Assessment and Plan:  Pregnancy: [id number] at [redacted]w[redacted]d 1. Supervision of other normal pregnancy, antepartum (Primary) 2. [redacted] weeks gestation of pregnancy Normal third tri labs yesterday Discussed increasing PO hydration and compression to see if dizziness resolves/recurs. Will  monitor symptoms and f/u as needed.   3. Female genital prolapse, unspecified type - Ambulatory referral to Physical Therapy  4. GAD (generalized anxiety disorder) No current meds  5. Rh negative state in antepartum period S/p rhogam 8/28  6. HSV-2 infection BID suppression at 35-36wk  7. Circumvallate placenta in third trimester @24 /2 EFW 712g (48%), AC 42%, circumvallate placenta noted Has f/u scan scheduled 9/26  Please refer to After Visit Summary for other counseling recommendations.   Future Appointments  Date Time Provider Department Center  05/30/2024 10:30 AM Rasch, Delon FERNS, NP [facility]  06/02/2024 11:00 AM [facility] PROVIDER 1 [facility]  06/02/2024 11:30 AM [facility] US2 [facility]  06/14/2024 10:30 AM Anyanwu, Gloris LABOR, MD [facility]    Kieth JAYSON Carolin, MD

## 2024-05-30 ENCOUNTER — Ambulatory Visit (INDEPENDENT_AMBULATORY_CARE_PROVIDER_SITE_OTHER): Admitting: Obstetrics and Gynecology

## 2024-05-30 ENCOUNTER — Other Ambulatory Visit (HOSPITAL_COMMUNITY)
Admission: RE | Admit: 2024-05-30 | Discharge: 2024-05-30 | Disposition: A | Discharge status: Home / Self Care | Source: Ambulatory Visit | Attending: Obstetrics and Gynecology | Admitting: Obstetrics and Gynecology

## 2024-05-30 ENCOUNTER — Encounter (HOSPITAL_COMMUNITY): Payer: Self-pay

## 2024-05-30 ENCOUNTER — Other Ambulatory Visit (HOSPITAL_COMMUNITY): Payer: Self-pay

## 2024-05-30 ENCOUNTER — Telehealth: Payer: Self-pay

## 2024-05-30 ENCOUNTER — Other Ambulatory Visit: Payer: Self-pay

## 2024-05-30 VITALS — BP 103/67 | HR 80 | Wt 202.0 lb

## 2024-05-30 DIAGNOSIS — O43113 Circumvallate placenta, third trimester: Secondary | ICD-10-CM | POA: Diagnosis not present

## 2024-05-30 DIAGNOSIS — O43119 Circumvallate placenta, unspecified trimester: Secondary | ICD-10-CM | POA: Insufficient documentation

## 2024-05-30 DIAGNOSIS — Z3A32 32 weeks gestation of pregnancy: Secondary | ICD-10-CM

## 2024-05-30 DIAGNOSIS — Z3480 Encounter for supervision of other normal pregnancy, unspecified trimester: Secondary | ICD-10-CM | POA: Diagnosis not present

## 2024-05-30 DIAGNOSIS — N898 Other specified noninflammatory disorders of vagina: Secondary | ICD-10-CM

## 2024-05-30 DIAGNOSIS — R21 Rash and other nonspecific skin eruption: Secondary | ICD-10-CM

## 2024-05-30 MED ORDER — TERCONAZOLE 0.4 % VA CREA
1.0000 | TOPICAL_CREAM | Freq: Every day | VAGINAL | 0 refills | Status: DC
  Filled 2024-05-30: qty 45, 7d supply, fill #0

## 2024-05-30 NOTE — Progress Notes (Signed)
   PRENATAL VISIT NOTE  Subjective:  Lauren Jensen is a 33 y.o. H5E7987 at [redacted]w[redacted]d being seen today for ongoing prenatal care.  She is currently monitored for the following issues for this high-risk pregnancy and has GAD (generalized anxiety disorder); HSV-2 infection; Rh negative state in antepartum period; Encounter for supervision of normal pregnancy, antepartum; Obesity affecting pregnancy, antepartum; and Circumvallate placenta on their problem list.  Patient reports Rash.  Contractions: Not present. Vag. Bleeding: None.  Movement: Present. Denies leaking of fluid.   The following portions of the patient's history were reviewed and updated as appropriate: allergies, current medications, past family history, past medical history, past social history, past surgical history and problem list.   Objective:    Vitals:   05/30/24 1025  BP: 103/67  Pulse: 80  Weight: 202 lb (91.6 kg)    Fetal Status:  Fetal Heart Rate (bpm): 155   Movement: Present    General: Alert, oriented and cooperative. Patient is in no acute distress.  Skin: Skin is warm and dry. No rash noted.   Cardiovascular: Normal heart rate noted  Respiratory: Normal respiratory effort, no problems with respiration noted  Abdomen: Soft, gravid, appropriate for gestational age.  Pain/Pressure: Present     Pelvic: Cervical exam deferred        Extremities: Normal range of motion.  Edema: None  Mental Status: Normal mood and affect. Normal behavior. Normal judgment and thought content.   Assessment and Plan:  Pregnancy: H5E7987 at [redacted]w[redacted]d 1. Vaginal itching (Primary)  - Completed 3 day OTC treatment without relief -Rx 7 day Terazol.  - Cervicovaginal ancillary only( [place])  2. Supervision of other normal pregnancy, antepartum  - Cervicovaginal ancillary only( [place]) - Plan for Valtrex  at 35 weeks for HSV suppression.   3. Circumvallate placenta in third trimester - Continue MFM follow up   4.  Rash  Itching started 4 days ago. Rash is raised, vesicular, erythematous, no drainage.  No pain.  Son had a small, similar area on his arm.  Has tried OTC hydrocortisone, without relief Discussed with Dr. Cleatus, recommend Derm evaluation. No pain, sporadic in location, unlikely shingles.          Preterm labor symptoms and general obstetric precautions including but not limited to vaginal bleeding, contractions, leaking of fluid and fetal movement were reviewed in detail with the patient. Please refer to After Visit Summary for other counseling recommendations.   No follow-ups on file.  Future Appointments  Date Time Provider Department Center  06/02/2024 11:00 AM [facility] PROVIDER 1 [facility]  06/02/2024 11:30 AM [facility] US2 [facility]  06/14/2024 10:30 AM Anyanwu, Gloris LABOR, MD [facility]  06/27/2024  9:30 AM Russell Josette LABOR, PT [facility] None  06/28/2024 10:30 AM Erik Kieth BROCKS, MD [facility]  07/05/2024 10:30 AM Cleatus Moccasin, MD [facility]    Delon Emms, NP

## 2024-05-30 NOTE — Telephone Encounter (Signed)
 RN called patient at request of provider to notify to follow up with dermatology regarding skin irritation at [facility] or [facility] in [place]. Patient verbalized understanding.  Silvano LELON Piano, RN

## 2024-05-31 ENCOUNTER — Other Ambulatory Visit (HOSPITAL_COMMUNITY): Payer: Self-pay

## 2024-05-31 ENCOUNTER — Other Ambulatory Visit: Payer: Self-pay

## 2024-05-31 LAB — CERVICOVAGINAL ANCILLARY ONLY
Bacterial Vaginitis (gardnerella): POSITIVE — AB
Candida Glabrata: NEGATIVE
Candida Vaginitis: POSITIVE — AB
Comment: NEGATIVE
Comment: NEGATIVE
Comment: NEGATIVE

## 2024-06-01 DIAGNOSIS — L237 Allergic contact dermatitis due to plants, except food: Secondary | ICD-10-CM | POA: Diagnosis not present

## 2024-06-02 ENCOUNTER — Ambulatory Visit

## 2024-06-07 ENCOUNTER — Ambulatory Visit: Attending: Obstetrics and Gynecology | Admitting: Obstetrics and Gynecology

## 2024-06-07 ENCOUNTER — Ambulatory Visit

## 2024-06-07 VITALS — BP 132/62 | HR 82

## 2024-06-07 DIAGNOSIS — Z3A33 33 weeks gestation of pregnancy: Secondary | ICD-10-CM

## 2024-06-07 DIAGNOSIS — Z362 Encounter for other antenatal screening follow-up: Secondary | ICD-10-CM | POA: Diagnosis not present

## 2024-06-07 DIAGNOSIS — O99213 Obesity complicating pregnancy, third trimester: Secondary | ICD-10-CM | POA: Insufficient documentation

## 2024-06-07 DIAGNOSIS — E669 Obesity, unspecified: Secondary | ICD-10-CM | POA: Diagnosis not present

## 2024-06-07 DIAGNOSIS — O43193 Other malformation of placenta, third trimester: Secondary | ICD-10-CM

## 2024-06-07 DIAGNOSIS — Z6791 Unspecified blood type, Rh negative: Secondary | ICD-10-CM

## 2024-06-07 DIAGNOSIS — O43112 Circumvallate placenta, second trimester: Secondary | ICD-10-CM

## 2024-06-07 DIAGNOSIS — O368130 Decreased fetal movements, third trimester, not applicable or unspecified: Secondary | ICD-10-CM | POA: Diagnosis not present

## 2024-06-07 DIAGNOSIS — O43113 Circumvallate placenta, third trimester: Secondary | ICD-10-CM | POA: Insufficient documentation

## 2024-06-07 DIAGNOSIS — O321XX0 Maternal care for breech presentation, not applicable or unspecified: Secondary | ICD-10-CM | POA: Diagnosis not present

## 2024-06-07 DIAGNOSIS — O99210 Obesity complicating pregnancy, unspecified trimester: Secondary | ICD-10-CM

## 2024-06-07 NOTE — Progress Notes (Signed)
 Maternal-Fetal Medicine Consultation  Name: Lauren Jensen  MRN: [id number]  GA: H5E7987 [redacted]w[redacted]d   Patient is here for fetal growth assessment.  Circumvallate placenta was seen at anatomy scan. She does not have gestational diabetes.  Blood pressure today at our office is 132/62 mmHg.  Ultrasound Normal fetal growth and amniotic fluid. Placenta appears normal with no evidence of circumvallate placenta. Breech presentation.  I reassured the patient of the findings that circumvallate placenta is not seen on today's ultrasound.  Usually, they resolve in the third trimester. Patient had questions about breech presentation.  I reassured her that in most cases it will revert to cephalic presentation. If breech presentation persist, external cephalic version may be performed at [redacted] weeks gestation and if successful, it is likely to prevent cesarean section.  Patient had 2 term vaginal deliveries (cephalic presentations) and it is unlikely she has uterine anomalies.   Recommendations -Follow-up scans as clinically indicated.      Consultation including face-to-face (more than 50%) counseling 20 minutes.

## 2024-06-14 ENCOUNTER — Ambulatory Visit (INDEPENDENT_AMBULATORY_CARE_PROVIDER_SITE_OTHER): Admitting: Obstetrics & Gynecology

## 2024-06-14 VITALS — BP 110/73 | HR 90 | Wt 203.0 lb

## 2024-06-14 DIAGNOSIS — B009 Herpesviral infection, unspecified: Secondary | ICD-10-CM

## 2024-06-14 DIAGNOSIS — R102 Pelvic and perineal pain unspecified side: Secondary | ICD-10-CM | POA: Diagnosis not present

## 2024-06-14 DIAGNOSIS — O26893 Other specified pregnancy related conditions, third trimester: Secondary | ICD-10-CM | POA: Diagnosis not present

## 2024-06-14 DIAGNOSIS — Z3A34 34 weeks gestation of pregnancy: Secondary | ICD-10-CM

## 2024-06-14 DIAGNOSIS — Z3480 Encounter for supervision of other normal pregnancy, unspecified trimester: Secondary | ICD-10-CM

## 2024-06-14 NOTE — Progress Notes (Signed)
 PRENATAL VISIT NOTE  Subjective:  Lauren Jensen is a 33 y.o. H5E7987 at [redacted]w[redacted]d being seen today for ongoing prenatal care.  She is currently monitored for the following issues for this low-risk pregnancy and has GAD (generalized anxiety disorder); HSV-2 infection; Rh negative state in antepartum period; Encounter for supervision of normal pregnancy, antepartum; Obesity affecting pregnancy, antepartum; and Circumvallate placenta on their problem list.  Patient reports increased pelvic pressure, wants to be checked.  Contractions: Irregular Garlan Irving). Vag. Bleeding: None.  Movement: Present. Denies leaking of fluid.   The following portions of the patient's history were reviewed and updated as appropriate: allergies, current medications, past family history, past medical history, past social history, past surgical history and problem list.   Objective:    Vitals:   06/14/24 1022  BP: 110/73  Pulse: 90  Weight: 203 lb (92.1 kg)    Fetal Status:  Fetal Heart Rate (bpm): 143   Movement: Present Presentation: Vertex (Verified on bedside ultrasound)  General: Alert, oriented and cooperative. Patient is in no acute distress.  Skin: Skin is warm and dry. No rash noted.   Cardiovascular: Normal heart rate noted  Respiratory: Normal respiratory effort, no problems with respiration noted  Abdomen: Soft, gravid, appropriate for gestational age.  Pain/Pressure: Present     Pelvic: Cervical exam performed in the presence of a chaperone Dilation: Closed Effacement (%): Thick Station: Ballotable  Extremities: Normal range of motion.  Edema: Trace  Mental Status: Normal mood and affect. Normal behavior. Normal judgment and thought content.    US  MFM OB FOLLOW UP Result Date: 06/07/2024 ----------------------------------------------------------------------  OBSTETRICS REPORT                       (Signed Final 06/07/2024 02:01 pm)  ---------------------------------------------------------------------- Patient Info  ID #:       [id number]                          D.O.B.:  1991/08/10 (33 yrs)(F)  Name:       Lauren Jensen                    Visit Date: 06/07/2024 07:24 am ---------------------------------------------------------------------- Performed By  Attending:        Fredia Fresh MD        Ref. Address:     [address]                                                             [place], [place]                                                             [address]  Performed By:     Jonette Nap        [facility]  Referred By:      [facility] ---------------------------------------------------------------------- Orders  #  Description                           Code        Ordered By  1  US  MFM OB FOLLOW UP                   [id number]    RAVI [facility] ----------------------------------------------------------------------  #  Order #                     Accession #                Episode #  1  [id number]                   [id number]                 [id number] ---------------------------------------------------------------------- Indications  Obesity complicating pregnancy, third          O99.213  trimester (BMI 32)  Rh negative state in antepartum                O36.0190  Circumvellate placenta                         O43.199  Low Risk NIPS, GTT wnl  [redacted] weeks gestation of pregnancy                Z3A.33  Encounter for other antenatal screening        Z36.2  follow-up ---------------------------------------------------------------------- Fetal Evaluation  Num Of Fetuses:         1  Fetal Heart Rate(bpm):  136  Cardiac Activity:       Observed  Presentation:           Breech   Placenta:               Circumvallate placenta (Resolved)  P. Cord Insertion:      Previously seen  Amniotic Fluid  AFI FV:      Within normal limits  AFI Sum(cm)     %Tile       Largest Pocket(cm)  10.67           23          3.43  RUQ(cm)       RLQ(cm)       LUQ(cm)        LLQ(cm)  2.33          3.43  1.75           3.16 ---------------------------------------------------------------------- Biometry  BPD:      83.1  mm     G. Age:  33w 3d         53  %    CI:        70.55   %    70 - 86                                                          FL/HC:      19.4   %    19.9 - 21.5  HC:      315.4  mm     G. Age:  35w 3d         72  %    HC/AC:      1.05        0.96 - 1.11  AC:       300   mm     G. Age:  34w 0d         75  %    FL/BPD:     73.5   %    71 - 87  FL:       61.1  mm     G. Age:  31w 5d          9  %    FL/AC:      20.4   %    20 - 24  LV:        2.8  mm  Est. FW:    2193  gm    4 lb 13 oz      50  % ---------------------------------------------------------------------- OB History  Blood Type:   A-  Gravidity:    4         Term:   2         SAB:   1  Living:       2 ---------------------------------------------------------------------- Gestational Age  LMP:           33w 1d        Date:  10/19/23                  EDD:   07/25/24  U/S Today:     33w 5d                                        EDD:   07/21/24  Best:          33w 1d     Det. By:  LMP  (10/19/23)          EDD:   07/25/24 ---------------------------------------------------------------------- Anatomy  Cranium:               Previously seen        Aortic Arch:            Previously seen  Cavum:                 Previously seen        Ductal Arch:            Previously seen  Ventricles:  Appears normal         Diaphragm:              Appears normal  Choroid Plexus:        Previously seen        Stomach:                Appears normal, left                                                                        sided  Cerebellum:             Previously seen        Abdomen:                Previously seen  Posterior Fossa:       Previously seen        Abdominal Wall:         Previously seen  Face:                  Orbits and profile     Cord Vessels:           Previously seen                         previously seen  Lips:                  Previously seen        Kidneys:                Appear normal  Thoracic:              Previously seen        Bladder:                Appears normal  Heart:                 Previously seen        Spine:                  Previously seen  RVOT:                  Previously seen        Upper Extremities:      Previously seen  LVOT:                  Previously seen        Lower Extremities:      Previously seen  Other:  Fetal anatomic survey complete on prior scans. ---------------------------------------------------------------------- Impression  Patient is here for fetal growth assessment.  Circumvallate  placenta was seen at anatomy scan.  She does not have gestational diabetes.  Blood pressure  today at our office is 132/62 mmHg.  Ultrasound  Normal fetal growth and amniotic fluid.  Placenta appears normal with no evidence of circumvallate  placenta.  Breech presentation.  I reassured the patient of the findings that circumvallate  placenta is not seen on today's ultrasound.  Usually, they  resolve in the third trimester.  Patient had questions about breech presentation.  I  reassured her that in most cases it will revert to cephalic  presentation.  If breech presentation persist, external cephalic version may  be performed at [redacted] weeks gestation and if successful, it is  likely to prevent cesarean section.  Patient had 2 term vaginal deliveries (cephalic presentations)  and it is unlikely she has uterine anomalies. ---------------------------------------------------------------------- Recommendations  -Follow-up scans as clinically indicated. ----------------------------------------------------------------------                  Fredia Fresh, MD Electronically Signed Final Report   06/07/2024 02:01 pm ----------------------------------------------------------------------    Assessment and Plan:  Pregnancy: H5E7987 at [redacted]w[redacted]d 1. Pelvic pressure in pregnancy, third trimester Closed cervix, cephalic fetal presentation. Patient reassured. PTL precautions reviewed.  2. HSV-2 infection On Valtrex  suppression.  3. [redacted] weeks gestation of pregnancy 4. Supervision of other normal pregnancy, antepartum (Primary) Discussed RSV vaccine, information given to her to review at home.  If she wants this, it will be given next visit. Preterm labor symptoms and general obstetric precautions including but not limited to vaginal bleeding, contractions, leaking of fluid and fetal movement were reviewed in detail with the patient. Please refer to After Visit Summary for other counseling recommendations.   Return in about 2 weeks (around 06/28/2024) for OFFICE OB VISIT (MD or APP).  Future Appointments  Date Time Provider Department Center  06/27/2024  9:30 AM Russell Josette LABOR, PT [facility] None  06/28/2024 10:30 AM Erik Kieth BROCKS, MD [facility]  07/05/2024 10:30 AM Cleatus Moccasin, MD [facility]  07/12/2024 10:30 AM Erik Kieth BROCKS, MD [facility]  07/19/2024 10:30 AM Cleatus Moccasin, MD [facility]    Gloris Hugger, MD

## 2024-06-14 NOTE — Patient Instructions (Signed)
 Return to office for any scheduled appointments. Call the office or go to the MAU at [facility] at [facility] if: You begin to have strong, frequent contractions Your water breaks.  Sometimes it is a big gush of fluid, sometimes it is just a trickle that keeps getting your underwear wet or running down your legs You have vaginal bleeding.  It is normal to have a small amount of spotting if your cervix was checked.  You do not feel your baby moving like normal.  If you do not, get something to eat and drink and lay down and focus on feeling your baby move.   If your baby is still not moving like normal, you should call the office or go to MAU. Any other obstetric concerns.     RSV Vaccination for Pregnant People  CDC recommends two ways to protect babies from getting very sick with Respiratory Syncytial Virus (RSV):  An RSV vaccination given during pregnancy  Pfizer's vaccine Verdis Frederickson) is recommended for use during pregnancy. It is given during RSV season to people who are 32 through [redacted] weeks pregnant.  Or, An RSV immunization given directly to infants and some older babies  Babies born to mothers who get RSV vaccine at least 2 weeks before delivery will have protection and, in most cases, should not need an RSV immunization later.    When is RSV season?  In most regions of the [place] RSV season starts in the fall and peaks in the winter, but the timing and severity of RSV season can vary from place to place and year to year.   The goal of maternal RSV vaccination is to protect babies from getting very sick with RSV during their first RSV season.  In most of the [place], this means maternal RSV vaccine will be given in September through January.  Who should get the maternal RSV vaccine?  People who are 79 through [redacted] weeks pregnant during September through January should get one dose of maternal RSV vaccine to protect their babies. RSV season  can vary around the country.   How is the maternal RSV vaccine administered?  Maternal RSV vaccine is given as a shot into the mother's upper arm. Only a single dose (one shot) of maternal RSV vaccine is recommended.   It is not yet known whether another dose might be needed in later pregnancies.  How well does the maternal RSV vaccine work?  When someone gets RSV vaccine, their body responds by making a protein that protects against the virus that causes RSV. The process takes about 2 weeks. When a pregnant person gets RSV vaccine, their protective proteins (called antibodies) also pass to their baby. So, babies who are born at least 2 weeks after their mother gets RSV vaccine are protected at birth, when infants are at the highest risk of severe RSV disease.   The vaccine can reduce a baby's risk of being hospitalized from RSV by 57% in the first six months after birth.  What are the possible side effects of the maternal RSV vaccine?  In the clinical trials, the side effects most often reported by pregnant people who received the maternal RSV vaccine were pain at the injection site, headache, muscle pain, and nausea.  Although not common, a dangerous high blood pressure condition called pre-eclampsia occurred in 1.8% of pregnant people who received the maternal RSV vaccine compared to 1.4% of pregnant people who received a placebo.  The clinical  trials identified a small increase in the number of preterm births in vaccinated pregnant people. It is not clear if this is a true safety problem related to RSV vaccine or if this occurred for reasons unrelated to vaccination.  To reduce the potential risk of preterm birth and complications from RSV disease, FDA approved the maternal RSV vaccine for use during weeks 32 through 30 of pregnancy while additional studies are conducted.  FDA is requiring the manufacturer to do additional studies that will look more closely at the potential risk of  preterm births and pregnancy-related high blood pressure issues in mothers, including pre-eclampsia.  Severe allergic reactions to vaccines are rare but can happen after any vaccine and can be life-threatening. If you see signs of a severe allergic reaction after vaccination (hives, swelling of the face and throat, difficulty breathing, a fast heartbeat, dizziness, or weakness), seek immediate medical care by calling 911.  As with any medicine or vaccine there is a very remote chance of the vaccine causing other serious injury or death after vaccination.  Adverse events following vaccination should be reported to the Vaccine Adverse Event Reporting System (VAERS), even if it's not clear that the vaccine caused the adverse event. You or your doctor can report an adverse event to [employer] and FDA through VAERS. If you need further assistance reporting to VAERS, please email [email or web address] [email or web address] or call [phone].  If you have any questions about side effects from the maternal RSV vaccine, talk with your healthcare provider.  Do I need a prescription for a maternal RSV vaccine?  Until the vaccine available in the office, you will need a prescription to take to a local pharmacy that is providing the vaccine.   How do I pay for the maternal RSV vaccine?  Most private health insurance plans cover the maternal RSV vaccine, but there may be a cost to you depending on your plan.  Contact your insurer to find out.  Medicaid Beginning June 07, 2022, most people with coverage from [facility] and [employer] [facility]) will be guaranteed coverage of all vaccines recommended by the Advisory Committee on Immunization Practice at no cost to them.   Source: [facility] for Immunization and Respiratory Diseases

## 2024-06-26 ENCOUNTER — Other Ambulatory Visit: Payer: Self-pay

## 2024-06-26 ENCOUNTER — Encounter: Payer: Self-pay | Admitting: Obstetrics and Gynecology

## 2024-06-26 DIAGNOSIS — N898 Other specified noninflammatory disorders of vagina: Secondary | ICD-10-CM

## 2024-06-26 MED ORDER — TERCONAZOLE 0.4 % VA CREA
1.0000 | TOPICAL_CREAM | Freq: Every day | VAGINAL | 0 refills | Status: DC
  Filled 2024-06-26: qty 45, 7d supply, fill #0

## 2024-06-27 ENCOUNTER — Other Ambulatory Visit: Payer: Self-pay

## 2024-06-27 ENCOUNTER — Ambulatory Visit: Attending: Obstetrics and Gynecology

## 2024-06-27 DIAGNOSIS — R102 Pelvic and perineal pain unspecified side: Secondary | ICD-10-CM | POA: Insufficient documentation

## 2024-06-27 DIAGNOSIS — N819 Female genital prolapse, unspecified: Secondary | ICD-10-CM | POA: Insufficient documentation

## 2024-06-27 DIAGNOSIS — M5459 Other low back pain: Secondary | ICD-10-CM | POA: Diagnosis not present

## 2024-06-27 DIAGNOSIS — R279 Unspecified lack of coordination: Secondary | ICD-10-CM | POA: Diagnosis not present

## 2024-06-27 DIAGNOSIS — R293 Abnormal posture: Secondary | ICD-10-CM | POA: Insufficient documentation

## 2024-06-27 DIAGNOSIS — M62838 Other muscle spasm: Secondary | ICD-10-CM | POA: Insufficient documentation

## 2024-06-27 DIAGNOSIS — M6281 Muscle weakness (generalized): Secondary | ICD-10-CM | POA: Insufficient documentation

## 2024-06-27 NOTE — Patient Instructions (Signed)
 Massage perineal scar tissue with coconut - back of the vagina 3x week, gently pressing into discomfort     Vulvar/vaginal Massage: This is a technique to help decrease painful sensitivity in the vaginal area. It can also help to restore normal moisture levels in the vaginal tissues. With coconut oil, aloe, jojoba oil, or a specific vaginal moisturizer, gently massage into vaginal tissues. Think of this as part of your post-shower routine and moisturizing just like you would the rest of the body with lotion. This helps to increase good blood flow to the vaginal tissues. In addition, it also teaches the body that touch to the vagina does not have to be painful or threatening, but moisturizing and gentle.      The first picture shows that there is no effect on the pelvic floor with gravity eliminated. The next three show that with a wedge pillow or a few pillows from home under your pelvis the pelvic floor is inverted and may relax and allows gravity to help return prolapsed areas more inward to help relieve symptoms. Do this 15-20 mins every evening when symptoms tend to be worse. Stop if you have pain or negative symptoms.    [facility] [address] [place], [place] [address] Phone # [phone] Fax [phone]

## 2024-06-27 NOTE — Therapy (Signed)
 OUTPATIENT PHYSICAL THERAPY FEMALE PELVIC EVALUATION   Patient Name: Lauren Jensen MRN: [id number] DOB:08/06/1991, 33 y.o., female Today's Date: 06/27/2024  END OF SESSION:  PT End of Session - 06/27/24 0934     Visit Number 1    Date for Recertification  07/25/24    Authorization Type [employer]    PT Start Time [phone]    PT Stop Time 1010    PT Time Calculation (min) 37 min    Activity Tolerance Patient tolerated treatment well    Behavior During Therapy [facility] for tasks assessed/performed          Past Medical History:  Diagnosis Date   Allergy    Anemia    Anxiety    Depression    Heart murmur    HSV-2 infection    Seasonal allergies 08/16/2018   Past Surgical History:  Procedure Laterality Date   NO PAST SURGERIES     Patient Active Problem List   Diagnosis Date Noted   Obesity affecting pregnancy, antepartum 02/21/2024   Encounter for supervision of normal pregnancy, antepartum 02/06/2022   Rh negative state in antepartum period 09/17/2021   HSV-2 infection 08/20/2021   GAD (generalized anxiety disorder) 08/16/2018    PCP: NA  REFERRING PROVIDER: Erik Kieth BROCKS, MD   REFERRING DIAG: N81.9 (ICD-10-CM) - Female genital prolapse, unspecified type  THERAPY DIAG:  Muscle weakness (generalized)  Unspecified lack of coordination  Other muscle spasm  Abnormal posture  Other low back pain  Pelvic pain  Rationale for Evaluation and Treatment: Rehabilitation  ONSET DATE: 8 months  SUBJECTIVE:                                                                                                                                                                                           SUBJECTIVE STATEMENT: Pt is [redacted] weeks pregnant - due date November 18th. Pt states that after second pregnancy she really started to feel like things felt different. She is having pain with intercourse and notices more vaginal pressure.    PAIN:  Are you having pain?  Yes NPRS scale: 4/10 Pain location: pelvic pain  Pain type: cramping and shooting pain Pain description: intermittent   Aggravating factors: activity, standing  Relieving factors: rest, lying or sitting down   PRECAUTIONS: None  RED FLAGS: None   WEIGHT BEARING RESTRICTIONS: No  FALLS:  Has patient fallen in last 6 months? 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  ACTIVITY LEVEL : sometimes, but not regular; some walking  PLOF: Independent  PATIENT GOALS: compliant with exercises; no pain during intercourse  PERTINENT HISTORY:  Anxiety, depression, HSV-2, H5E7987 (currently pregnant) Sexual abuse: No  BOWEL MOVEMENT: Pain with bowel movement: No Type of bowel movement:Frequency 1x/day and Strain no Fully empty rectum: Yes:   Leakage: No Pads: No Fiber supplement/laxative No  URINATION: Pain with urination: No Fully empty bladder: Yes:   Stream: Strong Urgency: sometimes with sudden kick from baby Frequency: 1.5-2 hours during the day; 1-2x/night Fluid Intake: not enough water Leakage: 2x when she was coughing she had small leak Pads: No  INTERCOURSE:  Ability to have vaginal penetration Yes  Pain with intercourse: Deep Penetration 7/10 DrynessYes  Climax: yes, ut during intercourse [name] Scale: 2/3 Lubricant: water based   PREGNANCY: Vaginal deliveries 2 Tearing Yes: 1st degree tear Episiotomy No C-section deliveries 0 Currently pregnant No  PROLAPSE: Pressure and Bulge - feels like the tissue is flush with the vaginal canal or coming out a little   OBJECTIVE:  Note: Objective measures were completed at Evaluation unless otherwise noted.  06/27/24 PATIENT SURVEYS:   PFIQ-7: 43  COGNITION: Overall cognitive status: Within functional limits for tasks assessed     SENSATION: Light touch: Appears intact   FUNCTIONAL TESTS:  Squat: Lt knee pain Single leg stance:  Rt: pelvic drop  Lt: pelvic drop Curl-up test:  abdominal distortion 4 finger width Transversus abdominus: difficulty with contraction and initially bearing down - able to correct with multimodal cues    GAIT: Assistive device utilized: None Comments: WNL  POSTURE: rounded shoulders, forward head, increased lumbar lordosis, anterior pelvic tilt, and elevated Rt iliac crest, Lt thoracic and lumbar curvature - mild   LUMBARAROM/PROM:  A/PROM A/PROM  Eval (% available)  Flexion 90, abdominal discomfort, Rt hamstring pull   Extension 75, abdominal distortion   Right lateral flexion 75   Left lateral flexion 100  Right rotation 100  Left rotation 100   (Blank rows = not tested)  PALPATION:   General: increased tightness in bil lumbar paraspinals and Rt glutes   Pelvic Alignment: Rt posterior rotation  Abdominal: increased costosternal angle; significant tenderness at pubic symphysis                External Perineal Exam: deferred                             Internal Pelvic Floor: deferred   Patient confirms identification and approves PT to assess internal pelvic floor and treatment Yes in future  PELVIC MMT: deferred   MMT eval  Vaginal   Internal Anal Sphincter   External Anal Sphincter   Puborectalis   Diastasis Recti 4 finger width  (Blank rows = not tested)        TONE: deferred  PROLAPSE: deferred  TODAY'S TREATMENT:                                                                                                                              DATE:  06/27/24 EVAL  Neuromuscular re-education: Transversus abdominus training with multimodal cues for improved motor control and breath coordination  Seated hip adduction ball press with transversus abdominus and pelvic floor muscle 2 x 10 Seated hip abduction red band with transversus abdominus and pelvic floor muscle 2 x 10 Seated resisted march red band with transversus abdominus and pelvic floor muscle 2 x 10 Cat cow 2 x 10 with cues for improved core  activation and breath coordination Therapeutic activities: Inverted lying position Perineal massage  Vulvovaginal massage Perineal massage    PATIENT EDUCATION:  Education details: See above Person educated: Patient Education method: Programmer, multimedia, Demonstration, Tactile cues, Verbal cues, and Handouts Education comprehension: verbalized understanding  HOME EXERCISE PROGRAM: [id number]  ASSESSMENT:  CLINICAL IMPRESSION: Patient is a 33 y.o. female who was seen today for physical therapy evaluation and treatment for sensation of vaginal bulge in third trimester, pregnant with third child. Exam findings notable for abnormal posture, core weakness demonstrated with pelvic drop in standing and diastasis recti with distortion, increased sternocostal angle, pubic symphysis tenderness, and muscular spasm in lumbar paraspinals and Rt glutes. Signs and symptoms are most consistent with pelvic floor muscle/core  weakness and poor pressure management that is being exacerbated by pregnancy. Initial treatment consisted of pressure management education, core activation training, gentle strengthening and mobility exercises, and inverted lying position to help manage symptoms. She will continue to benefit from skilled PT intervention in order to decrease vaginal pressure, address impairments, help prepare for successful delivery, and improve quality of life.   OBJECTIVE IMPAIRMENTS: decreased activity tolerance, decreased coordination, decreased endurance, decreased mobility, decreased ROM, decreased strength, increased fascial restrictions, increased muscle spasms, impaired flexibility, impaired tone, improper body mechanics, postural dysfunction, and pain.   ACTIVITY LIMITATIONS: carrying, lifting, bending, sitting, and standing  PARTICIPATION LIMITATIONS: cleaning, laundry, interpersonal relationship, community activity, occupation, and exercise and childcare activities   PERSONAL FACTORS: 3+  comorbidities: medical history are also affecting patient's functional outcome.   REHAB POTENTIAL: Good  CLINICAL DECISION MAKING: Stable/uncomplicated  EVALUATION COMPLEXITY: Low   GOALS: Goals reviewed with patient? Yes  SHORT TERM GOALS: Target date: 07/11/2024   Pt will be independent with HEP in order to improve activity tolerance.   Baseline: Goal status: INITIAL  2.  Pt will report 25% improvement in vaginal pressure in order to improve activity tolerance.   Baseline:  Goal status: INITIAL  3.  Pt will be able to correctly perform diaphragmatic breathing and appropriate pressure management in order to prevent worsening vaginal wall laxity and improve pelvic floor A/ROM.    Baseline:  Goal status: INITIAL  4.  Pt will be independent with perineal massage in order to reduce risk of tearing in delivery and decrease discomfort from vaginal wall laxity.  Baseline:  Goal status: INITIAL   LONG TERM GOALS: Target date: 07/25/24  Pt will be independent with advanced HEP in order to improve activity tolerance.   Baseline:  Goal status: INITIAL  2.  Pt will report 50% improvement in vaginal pressure in order to improve activity tolerance. Baseline:  Goal status: INITIAL  3.  Patient will report 75% improve in pelvic/low back pain in order to increase activity tolerance.   Baseline: 4/10 Goal status: INITIAL  4.  Patient will be able to perform single leg stance with good pelvic stability in order to demonstrate appropriate pelvic floor muscle and transversus abdominus strength and coordination in order to decrease pelvic pain and take care of children.   Baseline: pelvic drop  bil Goal status: INITIAL  5.  Pt will be able to perform curl-up test without abdominal distortion in order to demonstrate improved pressure manage and core activation to prevent worsening pelvic pain and injury.  Baseline: 4 finger width abdominal distortion Goal status: INITIAL  6.  Pt  will report 0/10 pain with vaginal penetration in order to improve intimate relationship with partner.    Baseline:  Goal status: INITIAL  PLAN:  PT FREQUENCY: 1-2x/week  PT DURATION: 4 visits    PLANNED INTERVENTIONS: 97164- PT Re-evaluation, 97110-Therapeutic exercises, 97530- Therapeutic activity, 97112- Neuromuscular re-education, 97535- Self Care, 02859- Manual therapy, [phone]- Gait training, [phone]- Aquatic Therapy, [phone]- Electrical stimulation (unattended), [phone]- Traction (mechanical), [id number]- Ionotophoresis 4mg /ml Dexamethasone, 79439 (1-2 muscles), 20561 (3+ muscles)- Dry Needling, Patient/Family education, Balance training, Taping, Joint mobilization, Joint manipulation, Spinal manipulation, Spinal mobilization, Scar mobilization, Vestibular training, Cryotherapy, Moist heat, and Biofeedback  PLAN FOR NEXT SESSION: possible internal pelvic floor muscle assessment if yeast infection is gone, pressure management, core strengthening, birth prep  Josette Mares, PT, DPT10/21/2511:18 AM [facility] [address] [place], [place] [address] Phone # [phone] Fax [phone]

## 2024-06-28 ENCOUNTER — Ambulatory Visit: Admitting: Obstetrics and Gynecology

## 2024-06-28 ENCOUNTER — Other Ambulatory Visit (HOSPITAL_COMMUNITY): Payer: Self-pay

## 2024-06-28 ENCOUNTER — Other Ambulatory Visit (HOSPITAL_COMMUNITY)
Admission: RE | Admit: 2024-06-28 | Discharge: 2024-06-28 | Disposition: A | Discharge status: Home / Self Care | Source: Ambulatory Visit | Attending: Obstetrics and Gynecology | Admitting: Obstetrics and Gynecology

## 2024-06-28 ENCOUNTER — Other Ambulatory Visit: Payer: Self-pay

## 2024-06-28 VITALS — BP 104/69 | HR 108 | Wt 204.0 lb

## 2024-06-28 DIAGNOSIS — Z6832 Body mass index (BMI) 32.0-32.9, adult: Secondary | ICD-10-CM | POA: Diagnosis not present

## 2024-06-28 DIAGNOSIS — O99343 Other mental disorders complicating pregnancy, third trimester: Secondary | ICD-10-CM

## 2024-06-28 DIAGNOSIS — Z6791 Unspecified blood type, Rh negative: Secondary | ICD-10-CM

## 2024-06-28 DIAGNOSIS — Z3480 Encounter for supervision of other normal pregnancy, unspecified trimester: Secondary | ICD-10-CM | POA: Insufficient documentation

## 2024-06-28 DIAGNOSIS — Z3A36 36 weeks gestation of pregnancy: Secondary | ICD-10-CM

## 2024-06-28 DIAGNOSIS — Z3483 Encounter for supervision of other normal pregnancy, third trimester: Secondary | ICD-10-CM | POA: Insufficient documentation

## 2024-06-28 DIAGNOSIS — O26893 Other specified pregnancy related conditions, third trimester: Secondary | ICD-10-CM | POA: Diagnosis not present

## 2024-06-28 DIAGNOSIS — F411 Generalized anxiety disorder: Secondary | ICD-10-CM

## 2024-06-28 DIAGNOSIS — B009 Herpesviral infection, unspecified: Secondary | ICD-10-CM

## 2024-06-28 DIAGNOSIS — N760 Acute vaginitis: Secondary | ICD-10-CM

## 2024-06-28 MED ORDER — CLOTRIMAZOLE 1 % VA CREA
TOPICAL_CREAM | VAGINAL | 1 refills | Status: DC
  Filled 2024-06-28: qty 135, 84d supply, fill #0

## 2024-06-28 NOTE — Progress Notes (Signed)
   PRENATAL VISIT NOTE  Subjective:  Lauren Jensen is a 33 y.o. H5E7987 at [redacted]w[redacted]d being seen today for ongoing prenatal care.  She is currently monitored for the following issues for this low-risk pregnancy and has GAD (generalized anxiety disorder); HSV-2 infection; Rh negative state in antepartum period; Encounter for supervision of normal pregnancy, antepartum; and Obesity affecting pregnancy, antepartum on their problem list.  Patient reports doing well overall.Very small amount of spotting that she thinks is related to irritation from VVC.  Contractions: Irregular. Vag. Bleeding: Scant.  Movement: Present. Denies leaking of fluid.   The following portions of the patient's history were reviewed and updated as appropriate: allergies, current medications, past family history, past medical history, past social history, past surgical history and problem list.   Objective:   Vitals:   06/28/24 1022  BP: 104/69  Pulse: (!) 108  Weight: 204 lb (92.5 kg)   Fetal Status: Fetal Heart Rate (bpm): 133   Movement: Present  Presentation: Vertex  General:  Alert, oriented and cooperative. Patient is in no acute distress.  Skin: Skin is warm and dry. No rash noted.   Cardiovascular: Normal heart rate noted  Respiratory: Normal respiratory effort, no problems with respiration noted  Abdomen: Soft, gravid, appropriate for gestational age.  Pain/Pressure: Present      Assessment and Plan:  Pregnancy: H5E7987 at [redacted]w[redacted]d 1. Supervision of other normal pregnancy, antepartum (Primary) 2. [redacted] weeks gestation of pregnancy Cephalic on US  today Growth US  10/1 AGA (obtained for circumvallate placenta), no additional growths scheduled - Strep Gp B Culture+Rflx - Cervicovaginal ancillary only  3. HSV-2 infection On valtrex  suppression  4. Rh negative state in antepartum period S/p rhogam   5. BMI 32.0-32.9,adult  6. GAD (generalized anxiety disorder) No current meds or mood concerns  7. Recurrent  vaginitis - Will do 2x/wk suppression for pregnancy/PP -     clotrimazole  (GYNE-LOTRIMIN ) 1 % vaginal cream; Place 1 Applicatorful vaginally at bedtime for 7 days, THEN 1 Applicatorful 2 (two) times a week.  Please refer to After Visit Summary for other counseling recommendations.   Return in about 1 week (around 07/05/2024) for return OB at 37 weeks.  Future Appointments  Date Time Provider Department Center  07/05/2024 10:30 AM Cleatus Moccasin, MD [facility]  07/06/2024 11:00 AM Helmus, [place], PT [facility] None  07/12/2024 10:30 AM Erik Kieth BROCKS, MD [facility]  07/19/2024 10:30 AM Cleatus Moccasin, MD [facility]    Kieth BROCKS Erik, MD

## 2024-06-29 LAB — CERVICOVAGINAL ANCILLARY ONLY
Bacterial Vaginitis (gardnerella): POSITIVE — AB
Candida Glabrata: NEGATIVE
Candida Vaginitis: POSITIVE — AB
Chlamydia: NEGATIVE
Comment: NEGATIVE
Comment: NEGATIVE
Comment: NEGATIVE
Comment: NEGATIVE
Comment: NORMAL
Neisseria Gonorrhea: NEGATIVE

## 2024-06-30 ENCOUNTER — Ambulatory Visit: Payer: Self-pay | Admitting: Obstetrics and Gynecology

## 2024-07-04 ENCOUNTER — Encounter: Payer: Self-pay | Admitting: Obstetrics and Gynecology

## 2024-07-04 DIAGNOSIS — B951 Streptococcus, group B, as the cause of diseases classified elsewhere: Secondary | ICD-10-CM | POA: Insufficient documentation

## 2024-07-04 LAB — STREP GP B SUSCEPTIBILITY

## 2024-07-04 LAB — STREP GP B CULTURE+RFLX: Strep Gp B Culture+Rflx: POSITIVE — AB

## 2024-07-05 ENCOUNTER — Encounter: Payer: Self-pay | Admitting: Obstetrics and Gynecology

## 2024-07-05 ENCOUNTER — Ambulatory Visit (INDEPENDENT_AMBULATORY_CARE_PROVIDER_SITE_OTHER): Admitting: Obstetrics and Gynecology

## 2024-07-05 VITALS — BP 107/73 | HR 90 | Wt 204.0 lb

## 2024-07-05 DIAGNOSIS — O99210 Obesity complicating pregnancy, unspecified trimester: Secondary | ICD-10-CM

## 2024-07-05 DIAGNOSIS — Z3A37 37 weeks gestation of pregnancy: Secondary | ICD-10-CM | POA: Diagnosis not present

## 2024-07-05 DIAGNOSIS — F411 Generalized anxiety disorder: Secondary | ICD-10-CM

## 2024-07-05 DIAGNOSIS — Z3480 Encounter for supervision of other normal pregnancy, unspecified trimester: Secondary | ICD-10-CM

## 2024-07-05 DIAGNOSIS — B951 Streptococcus, group B, as the cause of diseases classified elsewhere: Secondary | ICD-10-CM | POA: Diagnosis not present

## 2024-07-05 DIAGNOSIS — B009 Herpesviral infection, unspecified: Secondary | ICD-10-CM | POA: Diagnosis not present

## 2024-07-05 DIAGNOSIS — Z6791 Unspecified blood type, Rh negative: Secondary | ICD-10-CM

## 2024-07-05 MED ORDER — BUSPIRONE HCL 5 MG PO TABS: 5.0000 mg | ORAL_TABLET | Freq: Three times a day (TID) | ORAL | 3 refills | Status: DC

## 2024-07-05 NOTE — Progress Notes (Signed)
   PRENATAL VISIT NOTE  Subjective:  Lauren Jensen is a 33 y.o. H5E7987 at [redacted]w[redacted]d being seen today for ongoing prenatal care.  She is currently monitored for the following issues for this low-risk pregnancy and has GAD (generalized anxiety disorder); HSV-2 infection; Rh negative state in antepartum period; Encounter for supervision of normal pregnancy, antepartum; Obesity affecting pregnancy, antepartum; and Positive GBS test on their problem list.  Patient reports worsening anxiety.  Contractions: Irregular. Vag. Bleeding: None.  Movement: Present. Denies leaking of fluid.   The following portions of the patient's history were reviewed and updated as appropriate: allergies, current medications, past family history, past medical history, past social history, past surgical history and problem list.   Objective:    Vitals:   07/05/24 1027  BP: 107/73  Pulse: 90  Weight: 204 lb (92.5 kg)    Fetal Status:  Fetal Heart Rate (bpm): 129   Movement: Present    General: Alert, oriented and cooperative. Patient is in no acute distress.  Skin: Skin is warm and dry. No rash noted.   Cardiovascular: Normal heart rate noted  Respiratory: Normal respiratory effort, no problems with respiration noted  Abdomen: Soft, gravid, appropriate for gestational age.  Pain/Pressure: Present     Pelvic: Cervical exam performed in the presence of a chaperone Dilation: 1 Effacement (%): 50 Station: -2  Extremities: Normal range of motion.  Edema: None  Mental Status: Normal mood and affect. Normal behavior. Normal judgment and thought content.   Assessment and Plan:  Pregnancy: H5E7987 at [redacted]w[redacted]d 1. Supervision of other normal pregnancy, antepartum (Primary) GBS pos as noted below.  10/1 growth was 50%ile and normal AC and AFI. Done for circumvallate placenta - normal.  Leopold's to normal size.   2. Pregnancy with 37 weeks completed gestation  3. Positive GBS test ABX in labor.   4. Rh negative state in  antepartum period Rhogam as indicated  5. Obesity affecting pregnancy, antepartum, unspecified obesity type  6. HSV-2 infection On suppression  7. GAD (generalized anxiety disorder) More recently worsening. Had panic attack the other day. Partner not as supportive. Will send in buspar and if wants to start before delivery she can. Reviewed max dose.  Current therapist is working okay but we discussed we can use ours if she would like to switch.    Term labor symptoms and general obstetric precautions including but not limited to vaginal bleeding, contractions, leaking of fluid and fetal movement were reviewed in detail with the patient. Please refer to After Visit Summary for other counseling recommendations.   Return in about 1 week (around 07/12/2024) for OB VISIT, MD or APP.  Future Appointments  Date Time Provider Department Center  07/06/2024 11:00 AM Helmus, [place], [place] [facility] None  07/10/2024  8:00 AM Helmus, Jit[place], PT [facility] None  07/12/2024 10:30 AM Erik Kieth BROCKS, MD [facility]  07/17/2024 10:15 AM Donah Darryle RAMAN, PT [facility] None  07/19/2024 10:30 AM Cleatus Moccasin, MD [facility]    Moccasin Cleatus, MD

## 2024-07-06 ENCOUNTER — Ambulatory Visit: Payer: Self-pay | Admitting: Physical Therapy

## 2024-07-06 ENCOUNTER — Encounter: Payer: Self-pay | Admitting: Physical Therapy

## 2024-07-06 DIAGNOSIS — R279 Unspecified lack of coordination: Secondary | ICD-10-CM | POA: Diagnosis not present

## 2024-07-06 DIAGNOSIS — R102 Pelvic and perineal pain unspecified side: Secondary | ICD-10-CM | POA: Diagnosis not present

## 2024-07-06 DIAGNOSIS — M6281 Muscle weakness (generalized): Secondary | ICD-10-CM

## 2024-07-06 DIAGNOSIS — N819 Female genital prolapse, unspecified: Secondary | ICD-10-CM | POA: Diagnosis not present

## 2024-07-06 DIAGNOSIS — M62838 Other muscle spasm: Secondary | ICD-10-CM | POA: Diagnosis not present

## 2024-07-06 DIAGNOSIS — R293 Abnormal posture: Secondary | ICD-10-CM

## 2024-07-06 DIAGNOSIS — M5459 Other low back pain: Secondary | ICD-10-CM | POA: Diagnosis not present

## 2024-07-06 NOTE — Therapy (Signed)
 OUTPATIENT PHYSICAL THERAPY FEMALE PELVIC TREATMENT   Patient Name: Lauren Jensen MRN: [id number] DOB:[birth year], 33 y.o., female Today's Date: 07/06/2024  END OF SESSION:  PT End of Session - 07/06/24 1103     Visit Number 2    Date for Recertification  07/25/24    Authorization Type [employer]    PT Start Time 1103    PT Stop Time 1143    PT Time Calculation (min) 40 min    Activity Tolerance Patient tolerated treatment well    Behavior During Therapy [facility] for tasks assessed/performed           Past Medical History:  Diagnosis Date   Allergy    Anemia    Anxiety    Depression    Heart murmur    HSV-2 infection    Seasonal allergies 08/16/2018   Past Surgical History:  Procedure Laterality Date   NO PAST SURGERIES     Patient Active Problem List   Diagnosis Date Noted   Positive GBS test 07/04/2024   Obesity affecting pregnancy, antepartum 02/21/2024   Encounter for supervision of normal pregnancy, antepartum 02/06/2022   Rh negative state in antepartum period 09/17/2021   HSV-2 infection 08/20/2021   GAD (generalized anxiety disorder) 08/16/2018    PCP: NA  REFERRING PROVIDER: Erik Kieth BROCKS, MD   REFERRING DIAG: N81.9 (ICD-10-CM) - Female genital prolapse, unspecified type  THERAPY DIAG:  Muscle weakness (generalized)  Unspecified lack of coordination  Other muscle spasm  Abnormal posture  Other low back pain  Pelvic pain  Rationale for Evaluation and Treatment: Rehabilitation  ONSET DATE: 8 months  SUBJECTIVE:                                                                                                                                                                                           SUBJECTIVE STATEMENT: Patient reports that she is [redacted] weeks pregnant. She reports that vagina is collapsed a little bit, it affects her sex life.  Pressure of baby affecting things Fiance was concerned, patient was not She is not  confident in the look of it.  She is a engineer, civil (consulting).  Tried exercises 3x times, felt good.Has never tried to engage her core before in pregnancy, does not know why Does not have yeast infection anymore, wants to do internal today Has had some sciatica  From eval Pt is [redacted] weeks pregnant - due date November 18th. Pt states that after second pregnancy she really started to feel like things felt different. She is having pain with intercourse and notices more vaginal pressure.    PAIN:  Are you having  pain? Yes NPRS scale: 4/10 Pain location: pelvic pain  Pain type: cramping and shooting pain Pain description: intermittent   Aggravating factors: activity, standing  Relieving factors: rest, lying or sitting down   PRECAUTIONS: None  RED FLAGS: None   WEIGHT BEARING RESTRICTIONS: No  FALLS:  Has patient fallen in last 6 months? 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  ACTIVITY LEVEL : sometimes, but not regular; some walking  PLOF: Independent  PATIENT GOALS: compliant with exercises; no pain during intercourse  PERTINENT HISTORY:  Anxiety, depression, HSV-2, [id number] (currently pregnant) Sexual abuse: No  BOWEL MOVEMENT: Pain with bowel movement: No Type of bowel movement:Frequency 1x/day and Strain no Fully empty rectum: Yes:   Leakage: No Pads: No Fiber supplement/laxative No  URINATION: Pain with urination: No Fully empty bladder: Yes:   Stream: Strong Urgency: sometimes with sudden kick from baby Frequency: 1.5-2 hours during the day; 1-2x/night Fluid Intake: not enough water Leakage: 2x when she was coughing she had small leak Pads: No  INTERCOURSE:  Ability to have vaginal penetration Yes  Pain with intercourse: Deep Penetration 7/10 DrynessYes  Climax: yes, ut during intercourse [name] Scale: 2/3 Lubricant: water based   PREGNANCY: Vaginal deliveries 2 Tearing Yes: 1st degree tear Episiotomy No C-section deliveries 0 Currently  pregnant No  PROLAPSE: Pressure and Bulge - feels like the tissue is flush with the vaginal canal or coming out a little   OBJECTIVE:  Note: Objective measures were completed at Evaluation unless otherwise noted.  06/27/24 PATIENT SURVEYS:   PFIQ-7: 43  COGNITION: Overall cognitive status: Within functional limits for tasks assessed     SENSATION: Light touch: Appears intact   FUNCTIONAL TESTS:  Squat: Lt knee pain Single leg stance:  Rt: pelvic drop  Lt: pelvic drop Curl-up test: abdominal distortion 4 finger width Transversus abdominus: difficulty with contraction and initially bearing down - able to correct with multimodal cues    GAIT: Assistive device utilized: None Comments: WNL  POSTURE: rounded shoulders, forward head, increased lumbar lordosis, anterior pelvic tilt, and elevated Rt iliac crest, Lt thoracic and lumbar curvature - mild   LUMBARAROM/PROM:  A/PROM A/PROM  Eval (% available)  Flexion 90, abdominal discomfort, Rt hamstring pull   Extension 75, abdominal distortion   Right lateral flexion 75   Left lateral flexion 100  Right rotation 100  Left rotation 100   (Blank rows = not tested)  PALPATION:   General: increased tightness in bil lumbar paraspinals and Rt glutes   Pelvic Alignment: Rt posterior rotation  Abdominal: increased costosternal angle; significant tenderness at pubic symphysis                External Perineal Exam: normal appearing labia                             Internal Pelvic Floor: some weakness with contraction, improved with exhale and mild anterior vaginal wall laxity present  Patient confirms identification and approves PT to assess internal pelvic floor and treatment Yes in future  PELVIC MMT:    MMT eval  Vaginal 3/5,3 secs  Internal Anal Sphincter   External Anal Sphincter   Puborectalis   Diastasis Recti 4 finger width  (Blank rows = not tested)        TONE: average   PROLAPSE: mild anterior  vaginal wall checked in a.m. in supine and in standing   TODAY'S TREATMENT:  DATE:  07/06/2024 Internal pelvic floor assessment Patient confirms identification and approves physical therapist to perform internal soft tissue work   Neurom reed- cat/cow 10 reps                         Seated ball press 20 reps with exhale Education on pressure management Education on perineal massage, perineal massage demonstrated    06/27/24 EVAL  Neuromuscular re-education: Transversus abdominus training with multimodal cues for improved motor control and breath coordination  Seated hip adduction ball press with transversus abdominus and pelvic floor muscle 2 x 10 Seated hip abduction red band with transversus abdominus and pelvic floor muscle 2 x 10 Seated resisted march red band with transversus abdominus and pelvic floor muscle 2 x 10 Cat cow 2 x 10 with cues for improved core activation and breath coordination Therapeutic activities: Inverted lying position Perineal massage  Vulvovaginal massage Perineal massage    PATIENT EDUCATION:  Education details: See above Person educated: Patient Education method: Explanation, Demonstration, Tactile cues, Verbal cues, and Handouts Education comprehension: verbalized understanding  HOME EXERCISE PROGRAM: [id number]  ASSESSMENT:  CLINICAL IMPRESSION: Patient was seen today for treatment of vaginal pressure in pregnancy. Patient with some pelvic floor weakness and mild anterior vaginal wall laxity. Patient did well with exercises and education today. We discussed progress, exam findings and recommended continued HEP for pressure management and perineal massage. Treatment session focused on pelvic floor muscle assessment and exercises to improve pressure management. Patient had no difficulty with exercises. Patient is progressing  well towards goals and will benefit from continued PT to address deficits, reduce vaginal pressure and improve quality of life.        From eval Patient is a 33 y.o. female who was seen today for physical therapy evaluation and treatment for sensation of vaginal bulge in third trimester, pregnant with third child. Exam findings notable for abnormal posture, core weakness demonstrated with pelvic drop in standing and diastasis recti with distortion, increased sternocostal angle, pubic symphysis tenderness, and muscular spasm in lumbar paraspinals and Rt glutes. Signs and symptoms are most consistent with pelvic floor muscle/core  weakness and poor pressure management that is being exacerbated by pregnancy. Initial treatment consisted of pressure management education, core activation training, gentle strengthening and mobility exercises, and inverted lying position to help manage symptoms. She will continue to benefit from skilled PT intervention in order to decrease vaginal pressure, address impairments, help prepare for successful delivery, and improve quality of life.   OBJECTIVE IMPAIRMENTS: decreased activity tolerance, decreased coordination, decreased endurance, decreased mobility, decreased ROM, decreased strength, increased fascial restrictions, increased muscle spasms, impaired flexibility, impaired tone, improper body mechanics, postural dysfunction, and pain.   ACTIVITY LIMITATIONS: carrying, lifting, bending, sitting, and standing  PARTICIPATION LIMITATIONS: cleaning, laundry, interpersonal relationship, community activity, occupation, and exercise and childcare activities   PERSONAL FACTORS: 3+ comorbidities: medical history are also affecting patient's functional outcome.   REHAB POTENTIAL: Good  CLINICAL DECISION MAKING: Stable/uncomplicated  EVALUATION COMPLEXITY: Low   GOALS: Goals reviewed with patient? Yes  SHORT TERM GOALS: Target date: 07/11/2024   Pt will be  independent with HEP in order to improve activity tolerance.   Baseline: Goal status: INITIAL  2.  Pt will report 25% improvement in vaginal pressure in order to improve activity tolerance.   Baseline:  Goal status: INITIAL  3.  Pt will be able to correctly perform diaphragmatic breathing and appropriate pressure management in order to prevent worsening  vaginal wall laxity and improve pelvic floor A/ROM.    Baseline:  Goal status: INITIAL  4.  Pt will be independent with perineal massage in order to reduce risk of tearing in delivery and decrease discomfort from vaginal wall laxity.  Baseline:  Goal status: INITIAL   LONG TERM GOALS: Target date: 07/25/24  Pt will be independent with advanced HEP in order to improve activity tolerance.   Baseline:  Goal status: INITIAL  2.  Pt will report 50% improvement in vaginal pressure in order to improve activity tolerance. Baseline:  Goal status: INITIAL  3.  Patient will report 75% improve in pelvic/low back pain in order to increase activity tolerance.   Baseline: 4/10 Goal status: INITIAL  4.  Patient will be able to perform single leg stance with good pelvic stability in order to demonstrate appropriate pelvic floor muscle and transversus abdominus strength and coordination in order to decrease pelvic pain and take care of children.   Baseline: pelvic drop bil Goal status: INITIAL  5.  Pt will be able to perform curl-up test without abdominal distortion in order to demonstrate improved pressure manage and core activation to prevent worsening pelvic pain and injury.  Baseline: 4 finger width abdominal distortion Goal status: INITIAL  6.  Pt will report 0/10 pain with vaginal penetration in order to improve intimate relationship with partner.    Baseline:  Goal status: INITIAL  PLAN:  PT FREQUENCY: 1-2x/week  PT DURATION: 4 visits    PLANNED INTERVENTIONS: 97164- PT Re-evaluation, 97110-Therapeutic exercises, 97530-  Therapeutic activity, 97112- Neuromuscular re-education, 97535- Self Care, 02859- Manual therapy, [phone]- Gait training, [phone]- Aquatic Therapy, [phone]- Electrical stimulation (unattended), [phone]- Traction (mechanical), [id number]- Ionotophoresis 4mg /ml Dexamethasone, 79439 (1-2 muscles), 20561 (3+ muscles)- Dry Needling, Patient/Family education, Balance training, Taping, Joint mobilization, Joint manipulation, Spinal manipulation, Spinal mobilization, Scar mobilization, Vestibular training, Cryotherapy, Moist heat, and Biofeedback  PLAN FOR NEXT SESSION: possible internal pelvic floor muscle assessment if yeast infection is gone, pressure management, core strengthening, birth prep  Adeoluwa Silvers, PT, DPT10/30/2511:05 AM [facility] [address] [place], [place] [address] Phone # [phone] Fax [phone]

## 2024-07-10 ENCOUNTER — Ambulatory Visit: Payer: Self-pay | Attending: Obstetrics and Gynecology | Admitting: Physical Therapy

## 2024-07-10 ENCOUNTER — Encounter: Payer: Self-pay | Admitting: Physical Therapy

## 2024-07-10 DIAGNOSIS — R293 Abnormal posture: Secondary | ICD-10-CM | POA: Diagnosis not present

## 2024-07-10 DIAGNOSIS — M62838 Other muscle spasm: Secondary | ICD-10-CM | POA: Diagnosis not present

## 2024-07-10 DIAGNOSIS — M5459 Other low back pain: Secondary | ICD-10-CM | POA: Insufficient documentation

## 2024-07-10 DIAGNOSIS — R102 Pelvic and perineal pain unspecified side: Secondary | ICD-10-CM | POA: Insufficient documentation

## 2024-07-10 DIAGNOSIS — M6281 Muscle weakness (generalized): Secondary | ICD-10-CM | POA: Diagnosis not present

## 2024-07-10 DIAGNOSIS — R279 Unspecified lack of coordination: Secondary | ICD-10-CM | POA: Insufficient documentation

## 2024-07-10 NOTE — Therapy (Signed)
 OUTPATIENT PHYSICAL THERAPY FEMALE PELVIC TREATMENT   Patient Name: Lauren Jensen MRN: [id number] DOB:February 18, 1991, 33 y.o., female Today's Date: 07/10/2024  END OF SESSION:  PT End of Session - 07/10/24 0808     Visit Number 3    Date for Recertification  07/25/24    Authorization Type [employer]    PT Start Time [phone]    PT Stop Time 0845    PT Time Calculation (min) 38 min    Activity Tolerance Patient tolerated treatment well    Behavior During Therapy [facility] for tasks assessed/performed           Past Medical History:  Diagnosis Date   Allergy    Anemia    Anxiety    Depression    Heart murmur    HSV-2 infection    Seasonal allergies 08/16/2018   Past Surgical History:  Procedure Laterality Date   NO PAST SURGERIES     Patient Active Problem List   Diagnosis Date Noted   Positive GBS test 07/04/2024   Obesity affecting pregnancy, antepartum 02/21/2024   Encounter for supervision of normal pregnancy, antepartum 02/06/2022   Rh negative state in antepartum period 09/17/2021   HSV-2 infection 08/20/2021   GAD (generalized anxiety disorder) 08/16/2018    PCP: NA  REFERRING PROVIDER: Erik Kieth BROCKS, MD   REFERRING DIAG: N81.9 (ICD-10-CM) - Female genital prolapse, unspecified type  THERAPY DIAG:  Muscle weakness (generalized)  Unspecified lack of coordination  Other muscle spasm  Abnormal posture  Other low back pain  Pelvic pain  Rationale for Evaluation and Treatment: Rehabilitation  ONSET DATE: 8 months  SUBJECTIVE:                                                                                                                                                                                           SUBJECTIVE STATEMENT: Patient is 38 weeks tomorrow, 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 She reports that vagina is collapsed a little bit, it affects her sex life.  Pressure of baby affecting things Fiance was concerned, patient was not She is not confident in the look of it.  She is a engineer, civil (consulting).  Tried exercises 3x times, felt good.Has never tried to engage her core before in pregnancy, does not know why Does not have yeast infection anymore, wants to do internal today Has had some sciatica  From eval  Pt is [redacted] weeks pregnant - due date November 18th. Pt states that after second pregnancy she really started to feel like things felt different. She is having pain with intercourse and notices more vaginal pressure.    PAIN:  Are you having pain? Yes NPRS scale: 4/10 Pain location: pelvic pain  Pain type: cramping and shooting pain Pain description: intermittent   Aggravating factors: activity, standing  Relieving factors: rest, lying or sitting down   PRECAUTIONS: None  RED FLAGS: None   WEIGHT BEARING RESTRICTIONS: No  FALLS:  Has patient fallen in last 6 months? 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  ACTIVITY LEVEL : sometimes, but not regular; some walking  PLOF: Independent  PATIENT GOALS: compliant with exercises; no pain during intercourse  PERTINENT HISTORY:  Anxiety, depression, HSV-2, [id number] (currently pregnant) Sexual abuse: No  BOWEL MOVEMENT: Pain with bowel movement: No Type of bowel movement:Frequency 1x/day and Strain no Fully empty rectum: Yes:   Leakage: No Pads: No Fiber supplement/laxative No  URINATION: Pain with urination: No Fully empty bladder: Yes:   Stream: Strong Urgency: sometimes with sudden kick from baby Frequency: 1.5-2 hours during the day; 1-2x/night Fluid Intake: not enough water Leakage: 2x when she was coughing she had small leak Pads: No  INTERCOURSE:  Ability to have vaginal penetration Yes  Pain with intercourse: Deep Penetration  7/10 DrynessYes  Climax: yes, ut during intercourse [name] Scale: 2/3 Lubricant: water based   PREGNANCY: Vaginal deliveries 2 Tearing Yes: 1st degree tear Episiotomy No C-section deliveries 0 Currently pregnant No  PROLAPSE: Pressure and Bulge - feels like the tissue is flush with the vaginal canal or coming out a little   OBJECTIVE:  Note: Objective measures were completed at Evaluation unless otherwise noted.  06/27/24 PATIENT SURVEYS:   PFIQ-7: 43  COGNITION: Overall cognitive status: Within functional limits for tasks assessed     SENSATION: Light touch: Appears intact   FUNCTIONAL TESTS:  Squat: Lt knee pain Single leg stance:  Rt: pelvic drop  Lt: pelvic drop Curl-up test: abdominal distortion 4 finger width Transversus abdominus: difficulty with contraction and initially bearing down - able to correct with multimodal cues    GAIT: Assistive device utilized: None Comments: WNL  POSTURE: rounded shoulders, forward head, increased lumbar lordosis, anterior pelvic tilt, and elevated Rt iliac crest, Lt thoracic and lumbar curvature - mild   LUMBARAROM/PROM:  A/PROM A/PROM  Eval (% available)  Flexion 90, abdominal discomfort, Rt hamstring pull   Extension 75, abdominal distortion   Right lateral flexion 75   Left lateral flexion 100  Right rotation 100  Left rotation 100   (Blank rows = not tested)  PALPATION:   General: increased tightness in bil lumbar paraspinals and Rt glutes   Pelvic Alignment: Rt posterior rotation  Abdominal: increased costosternal angle; significant tenderness at pubic symphysis                External Perineal Exam: normal appearing labia                             Internal Pelvic Floor: some weakness with contraction, improved with exhale and mild anterior vaginal wall laxity present  Patient confirms identification and approves PT to assess internal pelvic floor and treatment Yes in future  PELVIC MMT:    MMT  eval  Vaginal 3/5,3 secs  Internal Anal Sphincter   External Anal Sphincter   Puborectalis  Diastasis Recti 4 finger width  (Blank rows = not tested)        TONE: average   PROLAPSE: mild anterior vaginal wall checked in a.m. in supine and in standing   TODAY'S TREATMENT:                                                                                                                              DATE:  07/10/2024 Cat/ cow 30 reps Addaday in side lying on lt with peanut ball Open books with foam roller 20 reps bilateral with diaphragmatic breathing Reverse clams 20 reps        07/06/2024 Internal pelvic floor assessment Patient confirms identification and approves physical therapist to perform internal soft tissue work   Neurom reed- cat/cow 10 reps                         Seated ball press 20 reps with exhale Education on pressure management Education on perineal massage, perineal massage demonstrated    06/27/24 EVAL  Neuromuscular re-education: Transversus abdominus training with multimodal cues for improved motor control and breath coordination  Seated hip adduction ball press with transversus abdominus and pelvic floor muscle 2 x 10 Seated hip abduction red band with transversus abdominus and pelvic floor muscle 2 x 10 Seated resisted march red band with transversus abdominus and pelvic floor muscle 2 x 10 Cat cow 2 x 10 with cues for improved core activation and breath coordination Therapeutic activities: Inverted lying position Perineal massage  Vulvovaginal massage Perineal massage    PATIENT EDUCATION:  Education details: See above Person educated: Patient Education method: Explanation, Demonstration, Tactile cues, Verbal cues, and Handouts Education comprehension: verbalized understanding  HOME EXERCISE PROGRAM: [id number]  ASSESSMENT:  CLINICAL IMPRESSION: Patient was seen today for treatment of vaginal pressure in pregnancy. Patient with some  pelvic floor weakness and mild anterior vaginal wall laxity. Patient did well with exercises, manual therapy and education today. We discussed progress, exam findings and recommended continued HEP for pressure management, perineal massage and added birth prep stretches to her HEP . Treatment session focused on massage with addaday for low back pain and exercises to reduce back pain and for birth prep Patient had no difficulty with exercises. Patient is progressing well towards goals and will benefit from continued PT to address deficits, reduce vaginal pressure and improve quality of life.        From eval Patient is a 33 y.o. female who was seen today for physical therapy evaluation and treatment for sensation of vaginal bulge in third trimester, pregnant with third child. Exam findings notable for abnormal posture, core weakness demonstrated with pelvic drop in standing and diastasis recti with distortion, increased sternocostal angle, pubic symphysis tenderness, and muscular spasm in lumbar paraspinals and Rt glutes. Signs and symptoms are most consistent with pelvic floor muscle/core  weakness and poor pressure management that is being exacerbated by pregnancy. Initial  treatment consisted of pressure management education, core activation training, gentle strengthening and mobility exercises, and inverted lying position to help manage symptoms. She will continue to benefit from skilled PT intervention in order to decrease vaginal pressure, address impairments, help prepare for successful delivery, and improve quality of life.   OBJECTIVE IMPAIRMENTS: decreased activity tolerance, decreased coordination, decreased endurance, decreased mobility, decreased ROM, decreased strength, increased fascial restrictions, increased muscle spasms, impaired flexibility, impaired tone, improper body mechanics, postural dysfunction, and pain.   ACTIVITY LIMITATIONS: carrying, lifting, bending, sitting, and  standing  PARTICIPATION LIMITATIONS: cleaning, laundry, interpersonal relationship, community activity, occupation, and exercise and childcare activities   PERSONAL FACTORS: 3+ comorbidities: medical history are also affecting patient's functional outcome.   REHAB POTENTIAL: Good  CLINICAL DECISION MAKING: Stable/uncomplicated  EVALUATION COMPLEXITY: Low   GOALS: Goals reviewed with patient? Yes  SHORT TERM GOALS: Target date: 07/11/2024   Pt will be independent with HEP in order to improve activity tolerance.   Baseline: Goal status: INITIAL  2.  Pt will report 25% improvement in vaginal pressure in order to improve activity tolerance.   Baseline:  Goal status: INITIAL  3.  Pt will be able to correctly perform diaphragmatic breathing and appropriate pressure management in order to prevent worsening vaginal wall laxity and improve pelvic floor A/ROM.    Baseline:  Goal status: INITIAL  4.  Pt will be independent with perineal massage in order to reduce risk of tearing in delivery and decrease discomfort from vaginal wall laxity.  Baseline:  Goal status: INITIAL   LONG TERM GOALS: Target date: 07/25/24  Pt will be independent with advanced HEP in order to improve activity tolerance.   Baseline:  Goal status: INITIAL  2.  Pt will report 50% improvement in vaginal pressure in order to improve activity tolerance. Baseline:  Goal status: INITIAL  3.  Patient will report 75% improve in pelvic/low back pain in order to increase activity tolerance.   Baseline: 4/10 Goal status: INITIAL  4.  Patient will be able to perform single leg stance with good pelvic stability in order to demonstrate appropriate pelvic floor muscle and transversus abdominus strength and coordination in order to decrease pelvic pain and take care of children.   Baseline: pelvic drop bil Goal status: INITIAL  5.  Pt will be able to perform curl-up test without abdominal distortion in order to  demonstrate improved pressure manage and core activation to prevent worsening pelvic pain and injury.  Baseline: 4 finger width abdominal distortion Goal status: INITIAL  6.  Pt will report 0/10 pain with vaginal penetration in order to improve intimate relationship with partner.    Baseline:  Goal status: INITIAL  PLAN:  PT FREQUENCY: 1-2x/week  PT DURATION: 4 visits    PLANNED INTERVENTIONS: 97164- PT Re-evaluation, 97110-Therapeutic exercises, 97530- Therapeutic activity, 97112- Neuromuscular re-education, 97535- Self Care, 02859- Manual therapy, [phone]- Gait training, [phone]- Aquatic Therapy, [phone]- Electrical stimulation (unattended), [phone]- Traction (mechanical), [id number]- Ionotophoresis 4mg /ml Dexamethasone, 79439 (1-2 muscles), 20561 (3+ muscles)- Dry Needling, Patient/Family education, Balance training, Taping, Joint mobilization, Joint manipulation, Spinal manipulation, Spinal mobilization, Scar mobilization, Vestibular training, Cryotherapy, Moist heat, and Biofeedback  PLAN FOR NEXT SESSION: possible internal pelvic floor muscle assessment if yeast infection is gone, pressure management, core strengthening, birth prep  Ladislao Cohenour, PT, DPT11/03/258:09 AM [facility] [address] [place], [place] [address] Phone # [phone] Fax [phone]

## 2024-07-11 ENCOUNTER — Other Ambulatory Visit (HOSPITAL_COMMUNITY): Payer: Self-pay

## 2024-07-12 ENCOUNTER — Other Ambulatory Visit (HOSPITAL_COMMUNITY)
Admission: RE | Admit: 2024-07-12 | Discharge: 2024-07-12 | Disposition: A | Discharge status: Home / Self Care | Source: Ambulatory Visit | Attending: Obstetrics and Gynecology | Admitting: Obstetrics and Gynecology

## 2024-07-12 ENCOUNTER — Ambulatory Visit (INDEPENDENT_AMBULATORY_CARE_PROVIDER_SITE_OTHER): Admitting: Obstetrics and Gynecology

## 2024-07-12 VITALS — BP 119/80 | HR 84 | Wt 206.0 lb

## 2024-07-12 DIAGNOSIS — O26893 Other specified pregnancy related conditions, third trimester: Secondary | ICD-10-CM | POA: Insufficient documentation

## 2024-07-12 DIAGNOSIS — B951 Streptococcus, group B, as the cause of diseases classified elsewhere: Secondary | ICD-10-CM

## 2024-07-12 DIAGNOSIS — F411 Generalized anxiety disorder: Secondary | ICD-10-CM

## 2024-07-12 DIAGNOSIS — N898 Other specified noninflammatory disorders of vagina: Secondary | ICD-10-CM | POA: Diagnosis not present

## 2024-07-12 DIAGNOSIS — Z6791 Unspecified blood type, Rh negative: Secondary | ICD-10-CM

## 2024-07-12 DIAGNOSIS — B009 Herpesviral infection, unspecified: Secondary | ICD-10-CM

## 2024-07-12 DIAGNOSIS — Z3A38 38 weeks gestation of pregnancy: Secondary | ICD-10-CM | POA: Diagnosis not present

## 2024-07-12 DIAGNOSIS — O26899 Other specified pregnancy related conditions, unspecified trimester: Secondary | ICD-10-CM

## 2024-07-12 DIAGNOSIS — Z3480 Encounter for supervision of other normal pregnancy, unspecified trimester: Secondary | ICD-10-CM

## 2024-07-12 DIAGNOSIS — O99343 Other mental disorders complicating pregnancy, third trimester: Secondary | ICD-10-CM

## 2024-07-12 NOTE — Progress Notes (Signed)
 Denies concerns today. Wants cx check. 1cm at last visit. Sched for induction 08/01/2024 @ midnight. Pt notified.

## 2024-07-12 NOTE — Progress Notes (Signed)
   PRENATAL VISIT NOTE  Subjective:  Lauren Jensen is a 33 y.o. H5E7987 at [redacted]w[redacted]d being seen today for ongoing prenatal care.  She is currently monitored for the following issues for this low-risk pregnancy and has GAD (generalized anxiety disorder); HSV-2 infection; Rh negative state in antepartum period; Encounter for supervision of normal pregnancy, antepartum; Obesity affecting pregnancy, antepartum; and Positive GBS test on their problem list.  Patient reports doing well overall. Notes irregular ctx starting last night. Contractions: Irregular. Vag. Bleeding: None.  Movement: Present. Denies leaking of fluid.   The following portions of the patient's history were reviewed and updated as appropriate: allergies, current medications, past family history, past medical history, past social history, past surgical history and problem list.   Objective:   Vitals:   07/12/24 1040  BP: 119/80  Pulse: 84  Weight: 206 lb (93.4 kg)    Fetal Status: Fetal Heart Rate (bpm): 148   Movement: Present     General:  Alert, oriented and cooperative. Patient is in no acute distress.  Skin: Skin is warm and dry. No rash noted.   Cardiovascular: Normal heart rate noted  Respiratory: Normal respiratory effort, no problems with respiration noted  Abdomen: Soft, gravid, appropriate for gestational age.  Pain/Pressure: Absent      Assessment and Plan:  Pregnancy: H5E7987 at [redacted]w[redacted]d 1. Supervision of other normal pregnancy, antepartum 2. [redacted] weeks gestation of pregnancy (Primary) Discussed option for membrane sweep next visit pdIOL ordered & induction orders placed  3. Vaginal discharge during pregnancy in third trimester Discussed OTC monistat for yeast. If +VVC with this swab, plan for 7 days course, then 2x/wk using OTC 7d monitstat - Cervicovaginal ancillary only( [facility])  4. Positive GBS test Ancef in labor  5. HSV-2 infection On suppression  6. Rh negative state in antepartum period S/p  rhogam  7. GAD (generalized anxiety disorder) Rx for buspar sent last visit  Term labor symptoms and general obstetric precautions including but not limited to vaginal bleeding, contractions, leaking of fluid and fetal movement were reviewed in detail with the patient.  Please refer to After Visit Summary for other counseling recommendations.   Return in about 1 week (around 07/19/2024) for return OB at 39 weeks.  Future Appointments  Date Time Provider Department Center  07/17/2024 10:15 AM Donah Darryle RAMAN, [place] [facility] None  07/19/2024 10:30 AM Cleatus Moccasin, MD [facility]   Kieth JAYSON Carolin, MD

## 2024-07-13 ENCOUNTER — Ambulatory Visit: Payer: Self-pay | Admitting: Obstetrics and Gynecology

## 2024-07-13 LAB — CERVICOVAGINAL ANCILLARY ONLY
Bacterial Vaginitis (gardnerella): POSITIVE — AB
Candida Glabrata: NEGATIVE
Candida Vaginitis: POSITIVE — AB
Comment: NEGATIVE
Comment: NEGATIVE
Comment: NEGATIVE

## 2024-07-15 NOTE — Progress Notes (Unsigned)
   PRENATAL VISIT NOTE  Subjective:  Lauren Jensen is a 33 y.o. H5E7987 at [redacted]w[redacted]d being seen today for ongoing prenatal care.  She is currently monitored for the following issues for this low-risk pregnancy and has GAD (generalized anxiety disorder); HSV-2 infection; Rh negative state in antepartum period; Encounter for supervision of normal pregnancy, antepartum; Obesity affecting pregnancy, antepartum; and Positive GBS test on their problem list.  Patient reports anxiety.  Contractions: Irregular. Vag. Bleeding: None.  Movement: Present. Denies leaking of fluid.   The following portions of the patient's history were reviewed and updated as appropriate: allergies, current medications, past family history, past medical history, past social history, past surgical history and problem list.   Objective:   Vitals:   07/19/24 1031  BP: 108/71  Pulse: 84  Weight: 208 lb (94.3 kg)    Fetal Status:  Fetal Heart Rate (bpm): 131 Fundal Height: 39 cm Movement: Present    General: Alert, oriented and cooperative. Patient is in no acute distress.  Skin: Skin is warm and dry. No rash noted.   Cardiovascular: Normal heart rate noted  Respiratory: Normal respiratory effort, no problems with respiration noted  Abdomen: Soft, gravid, appropriate for gestational age.  Pain/Pressure: Present     Pelvic: Cervical exam performed in the presence of a chaperone Dilation: 1 Effacement (%): 60 Station: -2  Extremities: Normal range of motion.  Edema: None  Mental Status: Normal mood and affect. Normal behavior. Normal judgment and thought content.    Assessment and Plan:  Pregnancy: H5E7987 at [redacted]w[redacted]d 1. Supervision of other normal pregnancy, antepartum (Primary) Discussed membrane sweep - pt declines today.   2. Pregnancy with 39 completed weeks gestation  3. HSV-2 infection On suppression  4. Positive GBS test Ancef in labor  5. Rh negative state in antepartum period S/p Rhogam. Fetal nips Rh pos.    6. Obesity affecting pregnancy, antepartum, unspecified obesity type 10/1 - normal growth. No additional imaging indicated.   7. GAD (generalized anxiety disorder) Started buspar 2 weeks ago - pt not sure if working. Will try 10 mg tid. If this is insufficient, then plan will be for trying a different medication (I.e. zoloft)  Term labor symptoms and general obstetric precautions including but not limited to vaginal bleeding, contractions, leaking of fluid and fetal movement were reviewed in detail with the patient. Please refer to After Visit Summary for other counseling recommendations.   Return in about 1 week (around 07/26/2024) for OB VISIT, MD or APP.  Future Appointments  Date Time Provider Department Center  08/01/2024 12:00 AM [facility] SCHED ROOM [facility] None  09/06/2024 10:30 AM Constant, [place], MD [facility]    Vina Solian, MD

## 2024-07-17 ENCOUNTER — Ambulatory Visit: Payer: Self-pay | Admitting: Physical Therapy

## 2024-07-18 ENCOUNTER — Ambulatory Visit

## 2024-07-18 DIAGNOSIS — R293 Abnormal posture: Secondary | ICD-10-CM | POA: Diagnosis not present

## 2024-07-18 DIAGNOSIS — M5459 Other low back pain: Secondary | ICD-10-CM | POA: Diagnosis not present

## 2024-07-18 DIAGNOSIS — M6281 Muscle weakness (generalized): Secondary | ICD-10-CM | POA: Diagnosis not present

## 2024-07-18 DIAGNOSIS — R279 Unspecified lack of coordination: Secondary | ICD-10-CM

## 2024-07-18 DIAGNOSIS — R102 Pelvic and perineal pain unspecified side: Secondary | ICD-10-CM

## 2024-07-18 DIAGNOSIS — M62838 Other muscle spasm: Secondary | ICD-10-CM | POA: Diagnosis not present

## 2024-07-18 NOTE — Patient Instructions (Signed)

## 2024-07-18 NOTE — Therapy (Signed)
 OUTPATIENT PHYSICAL THERAPY FEMALE PELVIC TREATMENT   Patient Name: Lauren Jensen MRN: [id number] DOB:May 23, 1991, 33 y.o., female Today's Date: 07/18/2024  END OF SESSION:  PT End of Session - 07/18/24 1151     Visit Number 4    Date for Recertification  07/25/24    Authorization Type [employer] Employee    PT Start Time 1145    PT Stop Time 1225    PT Time Calculation (min) 40 min    Activity Tolerance Patient tolerated treatment well    Behavior During Therapy [facility] for tasks assessed/performed            Past Medical History:  Diagnosis Date   Allergy    Anemia    Anxiety    Depression    Heart murmur    HSV-2 infection    Seasonal allergies 08/16/2018   Past Surgical History:  Procedure Laterality Date   NO PAST SURGERIES     Patient Active Problem List   Diagnosis Date Noted   Positive GBS test 07/04/2024   Obesity affecting pregnancy, antepartum 02/21/2024   Encounter for supervision of normal pregnancy, antepartum 02/06/2022   Rh negative state in antepartum period 09/17/2021   HSV-2 infection 08/20/2021   GAD (generalized anxiety disorder) 08/16/2018    PCP: NA  REFERRING PROVIDER: Erik Kieth BROCKS, MD   REFERRING DIAG: N81.9 (ICD-10-CM) - Female genital prolapse, unspecified type  THERAPY DIAG:  Muscle weakness (generalized)  Unspecified lack of coordination  Other muscle spasm  Abnormal posture  Other low back pain  Pelvic pain  Rationale for Evaluation and Treatment: Rehabilitation  ONSET DATE: 8 months  SUBJECTIVE:                                                                                                                                                                                           SUBJECTIVE STATEMENT: Pt is 39 weeks today.    PAIN:  Are you having pain? Yes NPRS scale: 5/10 Pain location: pelvic pain  Pain type: cramping and shooting pain Pain description: intermittent   Aggravating factors: activity,  standing  Relieving factors: rest, lying or sitting down   PRECAUTIONS: None  RED FLAGS: None   WEIGHT BEARING RESTRICTIONS: No  FALLS:  Has patient fallen in last 6 months? 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  ACTIVITY LEVEL : sometimes, but not regular; some walking  PLOF: Independent  PATIENT GOALS: compliant with exercises; no pain during intercourse  PERTINENT HISTORY:  Anxiety, depression, HSV-2, [id number] (currently pregnant) Sexual abuse: No  BOWEL MOVEMENT: Pain with bowel movement: No Type of bowel movement:Frequency 1x/day and  Strain no Fully empty rectum: Yes:   Leakage: No Pads: No Fiber supplement/laxative No  URINATION: Pain with urination: No Fully empty bladder: Yes:   Stream: Strong Urgency: sometimes with sudden kick from baby Frequency: 1.5-2 hours during the day; 1-2x/night Fluid Intake: not enough water Leakage: 2x when she was coughing she had small leak Pads: No  INTERCOURSE:  Ability to have vaginal penetration Yes  Pain with intercourse: Deep Penetration 7/10 DrynessYes  Climax: yes, ut during intercourse [name] Scale: 2/3 Lubricant: water based   PREGNANCY: Vaginal deliveries 2 Tearing Yes: 1st degree tear Episiotomy No C-section deliveries 0 Currently pregnant No  PROLAPSE: Pressure and Bulge - feels like the tissue is flush with the vaginal canal or coming out a little   OBJECTIVE:  Note: Objective measures were completed at Evaluation unless otherwise noted.  06/27/24 PATIENT SURVEYS:   PFIQ-7: 43  COGNITION: Overall cognitive status: Within functional limits for tasks assessed     SENSATION: Light touch: Appears intact   FUNCTIONAL TESTS:  Squat: Lt knee pain Single leg stance:  Rt: pelvic drop  Lt: pelvic drop Curl-up test: abdominal distortion 4 finger width Transversus abdominus: difficulty with contraction and initially bearing down - able to correct with multimodal cues     GAIT: Assistive device utilized: None Comments: WNL  POSTURE: rounded shoulders, forward head, increased lumbar lordosis, anterior pelvic tilt, and elevated Rt iliac crest, Lt thoracic and lumbar curvature - mild   LUMBARAROM/PROM:  A/PROM A/PROM  Eval (% available)  Flexion 90, abdominal discomfort, Rt hamstring pull   Extension 75, abdominal distortion   Right lateral flexion 75   Left lateral flexion 100  Right rotation 100  Left rotation 100   (Blank rows = not tested)  PALPATION:   General: increased tightness in bil lumbar paraspinals and Rt glutes   Pelvic Alignment: Rt posterior rotation  Abdominal: increased costosternal angle; significant tenderness at pubic symphysis                External Perineal Exam: normal appearing labia                             Internal Pelvic Floor: some weakness with contraction, improved with exhale and mild anterior vaginal wall laxity present  Patient confirms identification and approves PT to assess internal pelvic floor and treatment Yes in future  PELVIC MMT:    MMT eval  Vaginal 3/5,3 secs  Internal Anal Sphincter   External Anal Sphincter   Puborectalis   Diastasis Recti 4 finger width  (Blank rows = not tested)        TONE: average   PROLAPSE: mild anterior vaginal wall checked in a.m. in supine and in standing   TODAY'S TREATMENT:  DATE:  07/18/24 Trigger Point Dry Needling  Initial Treatment: Pt instructed on Dry Needling rational, procedures, and possible side effects. Pt instructed to expect mild to moderate muscle soreness later in the day and/or into the next day.  Pt instructed in methods to reduce muscle soreness. Pt instructed to continue prescribed HEP. Patient was educated on signs and symptoms of infection and other risk factors and advised to seek medical attention  should they occur.  Patient verbalized understanding of these instructions and education.   Patient Verbal Consent Given: Yes Education Handout Provided: Yes Muscles Treated: Lt glutes  Electrical Stimulation Performed: No Treatment Response/Outcome: twitch response and release   Manual: Soft tissue mobilization in Rt sidelying to lumbar paraspinals and Lt glutes Sidelying innominate mobilizations    07/10/2024 Cat/ cow 30 reps Addaday in side lying on lt with peanut ball Open books with foam roller 20 reps bilateral with diaphragmatic breathing Reverse clams 20 reps    07/06/2024 Internal pelvic floor assessment Patient confirms identification and approves physical therapist to perform internal soft tissue work   Neurom reed- cat/cow 10 reps                         Seated ball press 20 reps with exhale Education on pressure management Education on perineal massage, perineal massage demonstrated    PATIENT EDUCATION:  Education details: See above Person educated: Patient Education method: Programmer, Multimedia, Facilities Manager, Actor cues, Verbal cues, and Handouts Education comprehension: verbalized understanding  HOME EXERCISE PROGRAM: [id number]  ASSESSMENT:  CLINICAL IMPRESSION: Pt continues to have large amount of Lt sciatica pain that she feels like is preventing her from walking normally now. To help keep her active and able to perform other beneficial exercises, we discussed possible benefit of dry needling. She agreed to additional cost and we performed techniques today in Lt glues, tensor fascia latae, and piriformis with large twitch response and notable release of soft tissue restriction. She reportded good decrease in pain at end of session. Patient is progressing well towards goals and will benefit from continued PT to address deficits, reduce vaginal pressure and improve quality of life.      OBJECTIVE IMPAIRMENTS: decreased activity tolerance, decreased  coordination, decreased endurance, decreased mobility, decreased ROM, decreased strength, increased fascial restrictions, increased muscle spasms, impaired flexibility, impaired tone, improper body mechanics, postural dysfunction, and pain.   ACTIVITY LIMITATIONS: carrying, lifting, bending, sitting, and standing  PARTICIPATION LIMITATIONS: cleaning, laundry, interpersonal relationship, community activity, occupation, and exercise and childcare activities   PERSONAL FACTORS: 3+ comorbidities: medical history are also affecting patient's functional outcome.   REHAB POTENTIAL: Good  CLINICAL DECISION MAKING: Stable/uncomplicated  EVALUATION COMPLEXITY: Low   GOALS: Goals reviewed with patient? Yes  SHORT TERM GOALS: Target date: 07/11/2024   Pt will be independent with HEP in order to improve activity tolerance.   Baseline: Goal status: MET 07/18/24  2.  Pt will report 25% improvement in vaginal pressure in order to improve activity tolerance.   Baseline:  Goal status:  MET 07/18/24  3.  Pt will be able to correctly perform diaphragmatic breathing and appropriate pressure management in order to prevent worsening vaginal wall laxity and improve pelvic floor A/ROM.    Baseline:  Goal status:  MET 07/18/24  4.  Pt will be independent with perineal massage in order to reduce risk of tearing in delivery and decrease discomfort from vaginal wall laxity.  Baseline:  Goal status:  MET 07/18/24   LONG  TERM GOALS: Target date: 07/25/24  Pt will be independent with advanced HEP in order to improve activity tolerance.   Baseline:  Goal status: IN PROGRESS 07/18/24  2.  Pt will report 50% improvement in vaginal pressure in order to improve activity tolerance. Baseline:  Goal status: IN PROGRESS 07/18/24  3.  Patient will report 75% improve in pelvic/low back pain in order to increase activity tolerance.   Baseline: 4/10 Goal status: IN PROGRESS 07/18/24  4.  Patient will be  able to perform single leg stance with good pelvic stability in order to demonstrate appropriate pelvic floor muscle and transversus abdominus strength and coordination in order to decrease pelvic pain and take care of children.   Baseline: pelvic drop bil Goal status: IN PROGRESS 07/18/24  5.  Pt will be able to perform curl-up test without abdominal distortion in order to demonstrate improved pressure manage and core activation to prevent worsening pelvic pain and injury.  Baseline: 4 finger width abdominal distortion Goal status: IN PROGRESS 07/18/24  6.  Pt will report 0/10 pain with vaginal penetration in order to improve intimate relationship with partner.    Baseline:  Goal status: IN PROGRESS 07/18/24  PLAN:  PT FREQUENCY: 1-2x/week   PT DURATION: 4 visits   PLANNED INTERVENTIONS: 97164- PT Re-evaluation, 97110-Therapeutic exercises, 97530- Therapeutic activity, 97112- Neuromuscular re-education, 97535- Self Care, 02859- Manual therapy, [phone]- Gait training, [phone]- Aquatic Therapy, [phone]- Electrical stimulation (unattended), [phone]- Traction (mechanical), [id number]- Ionotophoresis 4mg /ml Dexamethasone, 79439 (1-2 muscles), 20561 (3+ muscles)- Dry Needling, Patient/Family education, Balance training, Taping, Joint mobilization, Joint manipulation, Spinal manipulation, Spinal mobilization, Scar mobilization, Vestibular training, Cryotherapy, Moist heat, and Biofeedback   PLAN FOR NEXT SESSION: possible internal pelvic floor muscle assessment if yeast infection is gone, pressure management, core strengthening, birth prep     Josette Mares, PT, DPT11/07/2511:30 PM [facility] [address] [place], [place] [address] Phone # [phone] Fax [phone]

## 2024-07-19 ENCOUNTER — Ambulatory Visit: Admitting: Obstetrics and Gynecology

## 2024-07-19 ENCOUNTER — Telehealth (HOSPITAL_COMMUNITY): Payer: Self-pay | Admitting: *Deleted

## 2024-07-19 ENCOUNTER — Other Ambulatory Visit: Payer: Self-pay | Admitting: Obstetrics and Gynecology

## 2024-07-19 VITALS — BP 108/71 | HR 84 | Wt 208.0 lb

## 2024-07-19 DIAGNOSIS — B009 Herpesviral infection, unspecified: Secondary | ICD-10-CM

## 2024-07-19 DIAGNOSIS — Z3A39 39 weeks gestation of pregnancy: Secondary | ICD-10-CM | POA: Diagnosis not present

## 2024-07-19 DIAGNOSIS — B951 Streptococcus, group B, as the cause of diseases classified elsewhere: Secondary | ICD-10-CM | POA: Diagnosis not present

## 2024-07-19 DIAGNOSIS — Z6791 Unspecified blood type, Rh negative: Secondary | ICD-10-CM

## 2024-07-19 DIAGNOSIS — Z3480 Encounter for supervision of other normal pregnancy, unspecified trimester: Secondary | ICD-10-CM

## 2024-07-19 DIAGNOSIS — O26899 Other specified pregnancy related conditions, unspecified trimester: Secondary | ICD-10-CM

## 2024-07-19 DIAGNOSIS — O99210 Obesity complicating pregnancy, unspecified trimester: Secondary | ICD-10-CM | POA: Diagnosis not present

## 2024-07-19 DIAGNOSIS — F411 Generalized anxiety disorder: Secondary | ICD-10-CM

## 2024-07-19 MED ORDER — BUSPIRONE HCL 10 MG PO TABS: 10.0000 mg | ORAL_TABLET | Freq: Three times a day (TID) | ORAL | 3 refills | Status: AC

## 2024-07-19 NOTE — Telephone Encounter (Signed)
 Preadmission screen

## 2024-07-20 ENCOUNTER — Other Ambulatory Visit (HOSPITAL_COMMUNITY): Payer: Self-pay

## 2024-07-21 ENCOUNTER — Telehealth (HOSPITAL_COMMUNITY): Payer: Self-pay | Admitting: *Deleted

## 2024-07-21 NOTE — Telephone Encounter (Signed)
 Preadmission screen

## 2024-07-22 ENCOUNTER — Encounter (HOSPITAL_COMMUNITY): Payer: Self-pay | Admitting: Obstetrics and Gynecology

## 2024-07-22 ENCOUNTER — Other Ambulatory Visit: Payer: Self-pay

## 2024-07-22 ENCOUNTER — Inpatient Hospital Stay (HOSPITAL_COMMUNITY)
Admission: AD | Admit: 2024-07-22 | Discharge: 2024-07-23 | DRG: 806 | Disposition: A | Discharge status: Home / Self Care | Attending: Obstetrics and Gynecology | Admitting: Obstetrics and Gynecology

## 2024-07-22 DIAGNOSIS — Z833 Family history of diabetes mellitus: Secondary | ICD-10-CM

## 2024-07-22 DIAGNOSIS — O26893 Other specified pregnancy related conditions, third trimester: Secondary | ICD-10-CM | POA: Diagnosis present

## 2024-07-22 DIAGNOSIS — Z880 Allergy status to penicillin: Secondary | ICD-10-CM

## 2024-07-22 DIAGNOSIS — A6000 Herpesviral infection of urogenital system, unspecified: Secondary | ICD-10-CM | POA: Diagnosis present

## 2024-07-22 DIAGNOSIS — Z3A39 39 weeks gestation of pregnancy: Secondary | ICD-10-CM | POA: Diagnosis not present

## 2024-07-22 DIAGNOSIS — Z6791 Unspecified blood type, Rh negative: Principal | ICD-10-CM

## 2024-07-22 DIAGNOSIS — O99824 Streptococcus B carrier state complicating childbirth: Secondary | ICD-10-CM | POA: Diagnosis not present

## 2024-07-22 DIAGNOSIS — O9832 Other infections with a predominantly sexual mode of transmission complicating childbirth: Secondary | ICD-10-CM | POA: Diagnosis not present

## 2024-07-22 DIAGNOSIS — O4202 Full-term premature rupture of membranes, onset of labor within 24 hours of rupture: Secondary | ICD-10-CM | POA: Diagnosis not present

## 2024-07-22 DIAGNOSIS — F411 Generalized anxiety disorder: Secondary | ICD-10-CM | POA: Diagnosis not present

## 2024-07-22 DIAGNOSIS — O99820 Streptococcus B carrier state complicating pregnancy: Secondary | ICD-10-CM | POA: Diagnosis not present

## 2024-07-22 DIAGNOSIS — O99344 Other mental disorders complicating childbirth: Secondary | ICD-10-CM | POA: Diagnosis present

## 2024-07-22 DIAGNOSIS — O26899 Other specified pregnancy related conditions, unspecified trimester: Principal | ICD-10-CM

## 2024-07-22 DIAGNOSIS — Z3480 Encounter for supervision of other normal pregnancy, unspecified trimester: Secondary | ICD-10-CM

## 2024-07-22 DIAGNOSIS — O99214 Obesity complicating childbirth: Secondary | ICD-10-CM | POA: Diagnosis not present

## 2024-07-22 LAB — TYPE AND SCREEN
ABO/RH(D): A NEG
Antibody Screen: POSITIVE

## 2024-07-22 LAB — CBC
HCT: 34.2 % — ABNORMAL LOW (ref 36.0–46.0)
Hemoglobin: 11.7 g/dL — ABNORMAL LOW (ref 12.0–15.0)
MCH: 31.4 pg (ref 26.0–34.0)
MCHC: 34.2 g/dL (ref 30.0–36.0)
MCV: 91.7 fL (ref 80.0–100.0)
Platelets: 228 K/uL (ref 150–400)
RBC: 3.73 MIL/uL — ABNORMAL LOW (ref 3.87–5.11)
RDW: 12.6 % (ref 11.5–15.5)
WBC: 8.4 K/uL (ref 4.0–10.5)
nRBC: 0 % (ref 0.0–0.2)

## 2024-07-22 LAB — RPR: RPR Ser Ql: NONREACTIVE

## 2024-07-22 LAB — POCT FERN TEST: POCT Fern Test: POSITIVE

## 2024-07-22 MED ORDER — ACETAMINOPHEN 325 MG PO TABS: 650.0000 mg | ORAL_TABLET | ORAL | Status: DC | PRN

## 2024-07-22 MED ORDER — LACTATED RINGERS IV SOLN: INTRAVENOUS | Status: DC

## 2024-07-22 MED ORDER — ONDANSETRON HCL 4 MG/2ML IJ SOLN: 4.0000 mg | INTRAMUSCULAR | Status: DC | PRN

## 2024-07-22 MED ORDER — SOD CITRATE-CITRIC ACID 500-334 MG/5ML PO SOLN: 30.0000 mL | ORAL | Status: DC | PRN

## 2024-07-22 MED ORDER — WITCH HAZEL-GLYCERIN EX PADS: 1.0000 | MEDICATED_PAD | CUTANEOUS | Status: DC | PRN

## 2024-07-22 MED ORDER — TETANUS-DIPHTH-ACELL PERTUSSIS 5-2-15.5 LF-MCG/0.5 IM SUSP: 0.5000 mL | Freq: Once | INTRAMUSCULAR | Status: DC

## 2024-07-22 MED ORDER — PRENATAL MULTIVITAMIN CH: 1.0000 | ORAL_TABLET | Freq: Every day | ORAL | Status: DC

## 2024-07-22 MED ORDER — ONDANSETRON HCL 4 MG/2ML IJ SOLN: 4.0000 mg | Freq: Four times a day (QID) | INTRAMUSCULAR | Status: DC | PRN

## 2024-07-22 MED ORDER — ONDANSETRON HCL 4 MG PO TABS: 4.0000 mg | ORAL_TABLET | ORAL | Status: DC | PRN

## 2024-07-22 MED ORDER — SENNOSIDES-DOCUSATE SODIUM 8.6-50 MG PO TABS
2.0000 | ORAL_TABLET | Freq: Every day | ORAL | Status: DC
  Administered 2024-07-23: 2 via ORAL
  Filled 2024-07-22: qty 2

## 2024-07-22 MED ORDER — DIPHENHYDRAMINE HCL 25 MG PO CAPS: 25.0000 mg | ORAL_CAPSULE | Freq: Four times a day (QID) | ORAL | Status: DC | PRN

## 2024-07-22 MED ORDER — IBUPROFEN 600 MG PO TABS
600.0000 mg | ORAL_TABLET | Freq: Four times a day (QID) | ORAL | Status: DC
  Administered 2024-07-22 – 2024-07-23 (×4): 600 mg via ORAL
  Filled 2024-07-22 (×5): qty 1

## 2024-07-22 MED ORDER — BUSPIRONE HCL 5 MG PO TABS
10.0000 mg | ORAL_TABLET | Freq: Three times a day (TID) | ORAL | Status: DC
  Administered 2024-07-22 – 2024-07-23 (×3): 10 mg via ORAL
  Filled 2024-07-22 (×3): qty 2

## 2024-07-22 MED ORDER — FENTANYL CITRATE (PF) 100 MCG/2ML IJ SOLN: 50.0000 ug | INTRAMUSCULAR | Status: DC | PRN

## 2024-07-22 MED ORDER — MISOPROSTOL 25 MCG QUARTER TABLET
25.0000 ug | ORAL_TABLET | Freq: Once | ORAL | Status: DC
Start: 2024-07-22 — End: 2024-07-22

## 2024-07-22 MED ORDER — DIBUCAINE (PERIANAL) 1 % EX OINT: 1.0000 | TOPICAL_OINTMENT | CUTANEOUS | Status: DC | PRN

## 2024-07-22 MED ORDER — SIMETHICONE 80 MG PO CHEW: 80.0000 mg | CHEWABLE_TABLET | ORAL | Status: DC | PRN

## 2024-07-22 MED ORDER — TERBUTALINE SULFATE 1 MG/ML IJ SOLN: 0.2500 mg | Freq: Once | INTRAMUSCULAR | Status: DC | PRN

## 2024-07-22 MED ORDER — SODIUM CHLORIDE 0.9% FLUSH: 3.0000 mL | INTRAVENOUS | Status: DC | PRN

## 2024-07-22 MED ORDER — SODIUM CHLORIDE 0.9% FLUSH: 3.0000 mL | Freq: Two times a day (BID) | INTRAVENOUS | Status: DC

## 2024-07-22 MED ORDER — CEFAZOLIN SODIUM-DEXTROSE 2-4 GM/100ML-% IV SOLN
2.0000 g | Freq: Once | INTRAVENOUS | Status: AC
  Administered 2024-07-22: 2 g via INTRAVENOUS
  Filled 2024-07-22: qty 100

## 2024-07-22 MED ORDER — OXYTOCIN BOLUS FROM INFUSION
333.0000 mL | Freq: Once | INTRAVENOUS | Status: AC
  Administered 2024-07-22: 333 mL via INTRAVENOUS

## 2024-07-22 MED ORDER — VALACYCLOVIR HCL 500 MG PO TABS
500.0000 mg | ORAL_TABLET | Freq: Two times a day (BID) | ORAL | Status: DC
Start: 2024-07-22 — End: 2024-07-23
  Administered 2024-07-22 – 2024-07-23 (×2): 500 mg via ORAL
  Filled 2024-07-22 (×2): qty 1

## 2024-07-22 MED ORDER — CEFAZOLIN SODIUM-DEXTROSE 1-4 GM/50ML-% IV SOLN
1.0000 g | Freq: Three times a day (TID) | INTRAVENOUS | Status: DC
  Filled 2024-07-22: qty 50

## 2024-07-22 MED ORDER — VALACYCLOVIR HCL 500 MG PO TABS
500.0000 mg | ORAL_TABLET | Freq: Two times a day (BID) | ORAL | Status: DC
  Administered 2024-07-22: 500 mg via ORAL
  Filled 2024-07-22: qty 1

## 2024-07-22 MED ORDER — COCONUT OIL OIL: 1.0000 | TOPICAL_OIL | Status: DC | PRN

## 2024-07-22 MED ORDER — LIDOCAINE HCL (PF) 1 % IJ SOLN
30.0000 mL | INTRAMUSCULAR | Status: AC | PRN
  Administered 2024-07-22: 30 mL via SUBCUTANEOUS
  Filled 2024-07-22: qty 30

## 2024-07-22 MED ORDER — LACTATED RINGERS IV SOLN: 500.0000 mL | INTRAVENOUS | Status: DC | PRN

## 2024-07-22 MED ORDER — OXYCODONE HCL 5 MG PO TABS
5.0000 mg | ORAL_TABLET | ORAL | Status: DC | PRN
  Administered 2024-07-22 – 2024-07-23 (×4): 5 mg via ORAL
  Filled 2024-07-22 (×4): qty 1

## 2024-07-22 MED ORDER — BUSPIRONE HCL 5 MG PO TABS: 5.0000 mg | ORAL_TABLET | Freq: Three times a day (TID) | ORAL | Status: DC

## 2024-07-22 MED ORDER — SODIUM CHLORIDE 0.9 % IV SOLN: 250.0000 mL | INTRAVENOUS | Status: DC | PRN

## 2024-07-22 MED ORDER — MISOPROSTOL 50MCG HALF TABLET: 50.0000 ug | ORAL_TABLET | Freq: Once | ORAL | Status: DC

## 2024-07-22 MED ORDER — OXYTOCIN-SODIUM CHLORIDE 30-0.9 UT/500ML-% IV SOLN
2.5000 [IU]/h | INTRAVENOUS | Status: DC
  Administered 2024-07-22: 2.5 [IU]/h via INTRAVENOUS
  Filled 2024-07-22: qty 500

## 2024-07-22 MED ORDER — BENZOCAINE-MENTHOL 20-0.5 % EX AERO: 1.0000 | INHALATION_SPRAY | CUTANEOUS | Status: DC | PRN

## 2024-07-22 NOTE — Lactation Note (Signed)
 This note was copied from a baby's chart. Lactation Consultation Note  Patient Name: Lauren Jensen Unijb'd Date: 07/22/2024 Age:33 hours Reason for consult: Initial assessment;Term  P3. 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 Tongue and lip tie corrected and helpful. Mom has been BF this baby well but he is pinching, mom is flanging lips but still hurts some. Mom stated Dr. Odette her that baby has a tight frenulum. Mom is having difficulty keeping top lip flanged. Mom has tried several different feeding positions to see what works best for right now. Mom is having bad cramps w/BF. Taking pain medication for pain. Mom doesn't have questions at this time. Encouraged to call for assistance as needed. Maternal Data Does the patient have breastfeeding experience prior to this delivery?: Yes How long did the patient breastfeed?: 1st-14 months, 2nd- 13 months  Feeding    LATCH Score Latch: Grasps breast easily, tongue down, lips flanged, rhythmical sucking.  Audible Swallowing: A few with stimulation  Type of Nipple: Everted at rest and after stimulation  Comfort (Breast/Nipple): Soft / non-tender  Hold (Positioning): No assistance needed to correctly position infant at breast.  LATCH Score: 9   Lactation Tools Discussed/Used    Interventions Interventions: Breast feeding basics reviewed;Skin to skin;Support pillows;Position options;Education;LC Services brochure  Discharge Pump: DEBP (spectra )  Consult Status Consult Status: Follow-up Date: 07/23/24 Follow-up type: In-patient    [name] 07/22/2024, 8:33 PM

## 2024-07-22 NOTE — Discharge Summary (Signed)
 Postpartum Discharge Summary  Date of Service updated***     Patient Name: Lauren Jensen DOB: 10-10-1990 MRN: [id number]  Date of admission: 07/22/2024 Delivery date:07/22/2024 Delivering provider: REGINO CREDIT A Date of discharge: 07/22/2024  Admitting diagnosis: Labor and delivery, indication for care [O75.9] Intrauterine pregnancy: [redacted]w[redacted]d     Secondary diagnosis:  Principal Problem:   Labor and delivery, indication for care  Additional problems: ***    Discharge diagnosis: Term Pregnancy Delivered                                              Post partum procedures:*** Augmentation: N/A Complications: None  Hospital course: Onset of Labor With Vaginal Delivery      33 y.o. yo H5E7987 at [redacted]w[redacted]d was admitted in Latent Labor on 07/22/2024. Labor course was complicated by none.   Membrane Rupture Time/Date: 2:15 AM,07/22/2024  Delivery Method:Vaginal, Spontaneous Operative Delivery:N/A Episiotomy: None Lacerations:  2nd degree;Periurethral;Labial Patient had a postpartum course complicated by ***.  She is ambulating, tolerating a regular diet, passing flatus, and urinating well. Patient is discharged home in stable condition on 07/22/24.  Newborn Data: Birth date:07/22/2024 Birth time:11:21 AM Gender:Female Living status:Living Apgars:8 ,9  Weight:   Magnesium  Sulfate received: No BMZ received: No Rhophylac :{Rhophylac  received:30440032} MMR:N/A T-DaP:Declined Flu: No RSV Vaccine received: No Transfusion:{Transfusion received:30440034}  Immunizations received: Immunization History  Administered Date(s) Administered   DTaP 08/30/1991, 10/11/1991, 12/04/1991, 08/21/1992, 06/09/1995   HIB (PRP-OMP) 08/30/1991, 10/11/1991, 12/04/1991, 08/21/1992   Hepatitis B 08/30/1991, 10/11/1991, 05/24/1992   Hepatitis B, PED/ADOLESCENT 08/30/1991, 10/11/1991, 05/14/1992   IPV 08/30/1991, 10/11/1991, 08/21/1992, 06/09/1995   Influenza,inj,quad, With Preservative 10/24/2020    MMR 08/21/1992, 06/09/1995   Td 03/21/2003, 06/01/2014   Tdap 06/01/2014, 12/08/2019    Physical exam  Vitals:   07/22/24 0735 07/22/24 0929 07/22/24 1029 07/22/24 1150  BP: 123/72 122/71 117/71 128/62  Pulse: 77 62 (!) 108 88  Resp: 18 17 18 18   Temp: 98.3 F (36.8 C) 98.4 F (36.9 C)    TempSrc: Oral Oral    SpO2: 99%     Weight:      Height:       General: {Exam; general:21111117} Lochia: {Desc; appropriate/inappropriate:30686::appropriate} Uterine Fundus: {Desc; firm/soft:30687} Incision: {Exam; incision:21111123} DVT Evaluation: {Exam; icu:7888877} Labs: Lab Results  Component Value Date   WBC 8.4 07/22/2024   HGB 11.7 (L) 07/22/2024   HCT 34.2 (L) 07/22/2024   MCV 91.7 07/22/2024   PLT 228 07/22/2024      Latest Ref Rng & Units 01/14/2022    9:35 AM  CMP  Glucose 70 - 99 mg/dL 79   BUN 6 - 20 mg/dL 5   Creatinine 9.42 - 8.99 mg/dL 9.52   Sodium 865 - 855 mmol/L 138   Potassium 3.5 - 5.2 mmol/L 3.8   Chloride 96 - 106 mmol/L 103   CO2 20 - 29 mmol/L 17   Calcium 8.7 - 10.2 mg/dL 8.4   Total Protein 6.0 - 8.5 g/dL 5.7   Total Bilirubin 0.0 - 1.2 mg/dL 0.3   Alkaline Phos 44 - 121 IU/L 106   AST 0 - 40 IU/L 14   ALT 0 - 32 IU/L 11    Edinburgh Score:    03/11/2022    4:48 PM  Edinburgh Postnatal Depression Scale Screening Tool  I have been able to laugh and see the  funny side of things. 0   I have looked forward with enjoyment to things. 0   I have blamed myself unnecessarily when things went wrong. 0   I have been anxious or worried for no good reason. 1   I have felt scared or panicky for no good reason. 0   Things have been getting on top of me. 2   I have been so unhappy that I have had difficulty sleeping. 0   I have felt sad or miserable. 0   I have been so unhappy that I have been crying. 0   The thought of harming myself has occurred to me. 0   Edinburgh Postnatal Depression Scale Total 3      Data saved with a previous flowsheet row  definition   No data recorded  After visit meds:  Allergies as of 07/22/2024       Reactions   Penicillins Hives   As toddler     Med Rec must be completed prior to using this [facility]***        Discharge home in stable condition Infant Feeding: {Baby feeding:23562} Infant Disposition:{CHL IP OB HOME WITH FNUYZM:76418} Discharge instruction: per After Visit Summary and Postpartum booklet. Activity: Advance as tolerated. Pelvic rest for 6 weeks.  Diet: {OB ipzu:78888878} Future Appointments: Future Appointments  Date Time Provider Department Center  08/01/2024 12:00 AM [facility] SCHED ROOM [facility] None  09/06/2024 10:30 AM Constant, Winton, MD [facility]   Follow up Visit:  Message sent to [facility] 07/22/24 by [facility], CNM Please schedule this patient for a In person postpartum visit in 6 weeks with the following provider: Camie Rote please. Additional Postpartum F/U:NA  Low risk pregnancy complicated by: NA Delivery mode:  Vaginal, Spontaneous Anticipated Birth Control:  LARC   07/22/2024 Camie DELENA Rote, CNM

## 2024-07-22 NOTE — MAU Note (Signed)

## 2024-07-22 NOTE — MAU Note (Signed)
 Pt says SROM at 0215- clear  Feels some irreg UC's . [place] - on Wed 1 cm  Has hx - HSV- takes Valtrex  daily. Last outbreak- 3 mths ago - Denies any S/S now.  Last movement was 1 hr ago

## 2024-07-22 NOTE — Progress Notes (Signed)
 MOB was referred for anxiety. * Referral screened out by Clinical Social Worker because none of the following criteria appear to apply: ~ History of anxiety/depression during this pregnancy, or of post-partum depression following prior delivery. ~ Diagnosis of anxiety and/or depression within last 3 years OR * MOB's symptoms currently being treated with medication and/or therapy. MOB is treating anxiety symptoms with Buspirone 10mg  three times daily and has a therapist.  Please contact the Clinical Social Worker if needs arise, by [facility] request, or if MOB scores greater than 9/yes to question 10 on Edinburgh Postpartum Depression Screen.  Signed,  Sharyne LOIS Roulette, MSW, LCSWA, LCASA 06-14-2024 4:10 PM

## 2024-07-23 ENCOUNTER — Other Ambulatory Visit (HOSPITAL_COMMUNITY): Payer: Self-pay

## 2024-07-23 LAB — CBC
HCT: 30 % — ABNORMAL LOW (ref 36.0–46.0)
Hemoglobin: 10.3 g/dL — ABNORMAL LOW (ref 12.0–15.0)
MCH: 31.1 pg (ref 26.0–34.0)
MCHC: 34.3 g/dL (ref 30.0–36.0)
MCV: 90.6 fL (ref 80.0–100.0)
Platelets: 219 K/uL (ref 150–400)
RBC: 3.31 MIL/uL — ABNORMAL LOW (ref 3.87–5.11)
RDW: 12.7 % (ref 11.5–15.5)
WBC: 11.1 K/uL — ABNORMAL HIGH (ref 4.0–10.5)
nRBC: 0 % (ref 0.0–0.2)

## 2024-07-23 MED ORDER — ACETAMINOPHEN 500 MG PO TABS
1000.0000 mg | ORAL_TABLET | Freq: Four times a day (QID) | ORAL | 0 refills | Status: AC | PRN
  Filled 2024-07-23: qty 60, 8d supply, fill #0

## 2024-07-23 MED ORDER — RHO D IMMUNE GLOBULIN 1500 UNIT/2ML IJ SOSY
300.0000 ug | PREFILLED_SYRINGE | Freq: Once | INTRAMUSCULAR | Status: AC
  Administered 2024-07-23: 300 ug via INTRAVENOUS
  Filled 2024-07-23: qty 2

## 2024-07-23 MED ORDER — IBUPROFEN 600 MG PO TABS
600.0000 mg | ORAL_TABLET | Freq: Four times a day (QID) | ORAL | 0 refills | Status: AC
  Filled 2024-07-23: qty 30, 8d supply, fill #0

## 2024-07-23 NOTE — Lactation Note (Signed)
 This note was copied from a baby's chart. Lactation Consultation Note  Patient Name: Lauren Jensen Date: 07/23/2024 Age:33 hours Reason for consult: Follow-up assessment;Term;Nipple pain/trauma;Infant weight loss;Breastfeeding assistance Per mom it's been noted the baby has a lip and tongue tie and lip tie . Baby ready to  feed and LC offered to assess the tongue mobility.  Mom receptive and LC agrees with the Tongue and lip tie findings.  LC mentioned to mom its important to make sure the areola is compressible so the baby can obtain a deep latch.  For the Latch, LC reviewed reverse pressure exercise and initially discomfort the baby and then improved with breast compressions.  Baby was still feeding with increased swallows.  LC provided shells and a  hand pump.  LC offered to request a LC O/P and mom receptive.   Maternal Data Has patient been taught Hand Expression?: Yes  Feeding Mother's Current Feeding Choice: Breast Milk  LATCH Score Latch: Grasps breast easily, tongue down, lips flanged, rhythmical sucking.  Audible Swallowing: Spontaneous and intermittent  Type of Nipple: Everted at rest and after stimulation  Comfort (Breast/Nipple): Soft / non-tender  Hold (Positioning): Assistance needed to correctly position infant at breast and maintain latch.  LATCH Score: 9   Lactation Tools Discussed/Used  Hand pump and shells   Interventions Interventions: Breast feeding basics reviewed;Assisted with latch;Skin to skin;Breast massage;Hand express;Pre-pump if needed;Reverse pressure;Breast compression;Adjust position;Support pillows;Position options;Shells;Hand pump;Education;LC Services brochure;CDC milk storage guidelines;CDC Guidelines for Breast Pump Cleaning  Discharge Discharge Education: Engorgement and breast care;Warning signs for feeding baby;Outpatient recommendation;Outpatient Epic message sent Pump: Personal;Manual;DEBP  Consult Status Consult  Status: Complete Date: 07/23/24    Rollene Jenkins Fiedler 07/23/2024, 2:19 PM

## 2024-07-24 LAB — RH IG WORKUP (INCLUDES ABO/RH)
Fetal Screen: NEGATIVE
Gestational Age(Wks): 39.4
Unit division: 0

## 2024-07-25 ENCOUNTER — Ambulatory Visit: Payer: Self-pay

## 2024-08-01 ENCOUNTER — Inpatient Hospital Stay (HOSPITAL_COMMUNITY)

## 2024-08-01 ENCOUNTER — Inpatient Hospital Stay (HOSPITAL_COMMUNITY): Admission: RE | Admit: 2024-08-01 | Source: Home / Self Care | Admitting: Obstetrics and Gynecology

## 2024-08-02 ENCOUNTER — Telehealth (HOSPITAL_COMMUNITY): Payer: Self-pay | Admitting: *Deleted

## 2024-08-02 NOTE — Telephone Encounter (Signed)
 08/02/2024  Name: Libbey Duce MRN: [id number] DOB: 1990-09-23  Reason for Call:  Transition of Care Hospital Discharge Call  Contact Status: Patient Contact Status: Complete  Language assistant needed: Interpreter Mode: Interpreter Not Needed        Follow-Up Questions: Do You Have Any Concerns About Your Health As You Heal From Delivery?: No Do You Have Any Concerns About Your Infants Health?: No  Edinburgh Postnatal Depression Scale:  In the Past 7 Days: I have been able to laugh and see the funny side of things.: Not quite so much now I have looked forward with enjoyment to things.: As much as I ever did I have blamed myself unnecessarily when things went wrong.: Not very often I have been anxious or worried for no good reason.: Hardly ever I have felt scared or panicky for no good reason.: No, not at all Things have been getting on top of me.: No, most of the time I have coped quite well I have been so unhappy that I have had difficulty sleeping.: Not at all I have felt sad or miserable.: Not very often I have been so unhappy that I have been crying.: Only occasionally The thought of harming myself has occurred to me.: Never Edinburgh Postnatal Depression Scale Total: 6  PHQ2-9 Depression Scale:     Discharge Follow-up: Edinburgh score requires follow up?: No Patient was advised of the following resources:: Support Group, Breastfeeding Support Group  Post-discharge interventions: Reviewed Newborn Safe Sleep Practices  Mliss Sieve, RN 08/02/2024 15:41

## 2024-08-11 NOTE — H&P (Signed)
 OBSTETRIC ADMISSION HISTORY AND PHYSICAL  Lauren Jensen is 33 y.o. [phone] with IUP at [redacted]w[redacted]d 07/25/2024, by Last Menstrual Period presenting for SOL. She received her prenatal care at [facility]   ROS (+) FM, ctx present, regular (-) VB, LOF. HA, visual changes, CP, SOB, RUQ pain, peripheral edema.   Prenatal History/Complications NURSING  PROVIDER  Office Location [place] Dating by U/S at 9 wks  [facility] Model Traditional Anatomy U/S Normal  Initiated care at  [employer]  English               LAB RESULTS   Support Person Lauren Jensen NIPS: Low Risk Female. AFP: Not done      NT/IT (FT only)        Carrier Screen Horizon: Negative 4/4  Rhogam  Given 8/28 A1C/GTT Early HgbA1C: 5.6% Third trimester 2 hr GTT: normal  Flu Vaccine Declined 06/28/24      TDaP Vaccine  Declined 06/28/24 Blood Type A/Negative/-- (05/06 1640)  RSV Vaccine Declined 06/28/24 Antibody Negative (05/06 1640)  COVID Vaccine   Rubella 12.00 (05/06 1640)  Feeding Plan breast RPR Non Reactive (09/10 9077)  Contraception Considering LARC/nexplanon HBsAg Negative (05/06 1640)  Circumcision Yes if boy HIV Non Reactive (09/10 9077)  Pediatrician  [facility] HCVAb Non Reactive (05/06 1640)  Prenatal Classes        BTL Consent   Pap 2022- normal   BTL Pre-payment   GC/CT Initial:   36wks:    VBAC Consent   GBS   For PCN allergy, check sensitivities   BRx Optimized? [ ]  yes   [ ]  no      DME Rx [ ]  BP cuff [ ]  Weight Scale Waterbirth  [ ]  Class [ ]  Consent [ ]  CNM visit  PHQ9 & GAD7 [  ] new OB [  ] 75 weeks  [  ] 36 weeks Induction  [ ]  Orders Entered [ ] Foley Y/N    OB History  Gravida Para Term Preterm AB Living  4 3 3  0 1 3  SAB IAB Ectopic Multiple Live Births  0 1 0 0 3    # Outcome Date GA Lbr Len/2nd Weight Sex Type Anes PTL Lv  4 Term 07/22/24 104w4d 00:45 / 00:11 3800 g M Vag-Spont Local  LIV  3 Term 02/06/22 [redacted]w[redacted]d 05:51 / 00:06 3960 g M Vag-Spont None  LIV  2 Term 03/13/20 [redacted]w[redacted]d   3711 g F Vag-Spont  N LIV     Birth Comments: induced for postdates  1 IAB 2013           Patient Active Problem List   Diagnosis Date Noted   SVD (spontaneous vaginal delivery) 07/23/2024   Positive GBS test 07/04/2024   Obesity affecting pregnancy, antepartum 02/21/2024   Encounter for supervision of normal pregnancy, antepartum 02/06/2022   Rh negative state in antepartum period 09/17/2021   HSV-2 infection 08/20/2021   GAD (generalized anxiety disorder) 08/16/2018    Past Medical History: Past Medical History:  Diagnosis Date   Allergy    Anemia    Anxiety    Depression    Heart murmur    HSV-2 infection    Seasonal allergies 08/16/2018    Past Surgical History: Past Surgical History:  Procedure Laterality Date   NO PAST SURGERIES      Social History Social History   Socioeconomic History  Marital status: Single    Spouse name: Not on file   Number of children: Not on file   Years of education: Not on file   Highest education level: Not on file  Occupational History   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[employer]  Tobacco Use   Smoking status: Never   Smokeless tobacco: Never  Vaping Use   Vaping status: Never Used  Substance and Sexual Activity   Alcohol use: Not Currently    Comment: not weekly   Drug use: Never   Sexual activity: Yes    Partners: Male  Other Topics Concern   Not on file  Social History Narrative   Not on file   Social Drivers of Health   Financial Resource Strain: Low Risk  (12/30/2022)   Received from [facility]   Overall Financial Resource Strain (CARDIA)    Difficulty of Paying Living Expenses: Not very hard  Food Insecurity: No Food Insecurity (07/22/2024)   Hunger Vital Sign    Worried About Running Out of Food in the Last Year: Never true    Ran Out of Food in the Last Year: Never true  Transportation Needs: No Transportation Needs (07/22/2024)   PRAPARE - Administrator, Civil Service (Medical): No    Lack of  Transportation (Non-Medical): No  Physical Activity: Insufficiently Active (12/30/2022)   Received from [facility]   Exercise Vital Sign    On average, how many days per week do you engage in moderate to strenuous exercise (like a brisk walk)?: 2 days    On average, how many minutes do you engage in exercise at this level?: 20 min  Stress: No Stress Concern Present (12/30/2022)   Received from [facility] of Occupational Health - Occupational Stress Questionnaire    Feeling of Stress : Only a little  Social Connections: Moderately Integrated (12/30/2022)   Received from [facility]   Social Network    How would you rate your social network (family, work, friends)?: Adequate participation with social networks    Family History: Family History  Problem Relation Age of Onset   Asthma Mother    Allergies Mother    Mental illness Father    Diabetes Father    Atrial fibrillation Father    Mental illness Sister    Mental illness Brother    Breast cancer Maternal Grandmother    Allergies Maternal Grandfather    Colon cancer Paternal Grandfather     Allergies: Allergies  Allergen Reactions   Penicillins Hives    As toddler    No medications prior to admission.     Review of Systems  All systems reviewed and negative except as stated in HPI  PHYSICAL EXAM Blood pressure 118/66, pulse 79, temperature 98.5 F (36.9 C), temperature source Oral, resp. rate 18, height 5' 5 (1.651 m), weight 95.7 kg, last menstrual period 10/19/2023, SpO2 99%, unknown if currently breastfeeding. General appearance: alert, cooperative, appears stated age, and no distress Lungs: respirations nonlabored Heart: regular rate Abdomen: gravid  Fetal monitoringBaseline: 135 bpm, Variability: Good {> 6 bpm), and Accelerations: Reactive Uterine activity Frequency: Every 2-5 minutes, Duration: 60-90 seconds, and Intensity: strong  Dilation: 6 Effacement (%): 90 Station:  -2 Exam by:: Lauren Brain RN Presentation: cephalic   Prenatal labs: ABO, Rh: --/--/A NEG (11/15 0424) Antibody: POS (11/15 0424) Rubella: 12.00 (05/06 1640) RPR: NON REACTIVE (11/15 0424)  HBsAg: Negative (05/06 1640)  HIV: Non Reactive (09/10 9077)   Lab Results  Component Value Date   GBS Positive (A) 06/28/2024    Anatomy US : WNL  Immunization History  Administered Date(s) Administered   DTaP 08/30/1991, 10/11/1991, 12/04/1991, 08/21/1992, 06/09/1995   HIB (PRP-OMP) 08/30/1991, 10/11/1991, 12/04/1991, 08/21/1992   Hepatitis B 08/30/1991, 10/11/1991, 05/24/1992   Hepatitis B, PED/ADOLESCENT 08/30/1991, 10/11/1991, 05/14/1992   IPV 08/30/1991, 10/11/1991, 08/21/1992, 06/09/1995   Influenza,inj,quad, With Preservative 10/24/2020   MMR 08/21/1992, 06/09/1995   Td 03/21/2003, 06/01/2014   Tdap 06/01/2014, 12/08/2019    Prenatal Transfer Tool  Maternal Diabetes: No Genetic Screening: Normal Maternal Ultrasounds/Referrals: Normal Fetal Ultrasounds or other Referrals:  None Maternal Substance Abuse:  No Significant Maternal Medications:  None Significant Maternal Lab Results: GBS pos, Rh neg Number of Prenatal Visits:greater than 3 verified prenatal visits Maternal Vaccinations:Declines all vaccines Other Comments:  None   No results found for this or any previous visit (from the past 24 hours).  Patient Active Problem List   Diagnosis Date Noted   SVD (spontaneous vaginal delivery) 07/23/2024   Positive GBS test 07/04/2024   Obesity affecting pregnancy, antepartum 02/21/2024   Encounter for supervision of normal pregnancy, antepartum 02/06/2022   Rh negative state in antepartum period 09/17/2021   HSV-2 infection 08/20/2021   GAD (generalized anxiety disorder) 08/16/2018    ASSESSMENT & PLAN Ellanora Rayborn is 33 y.o. [id number] with IUP at [redacted]w[redacted]d 07/25/2024, by Last Menstrual Period admitted for SOL.  Sono at [redacted]w[redacted]d: normal anatomy, cephalic presentation,  circumvallate posterior placenta, EFW 321g, (74%)  #Labor: SOL in active phase.  #Pain: Per patient preference, encourage ambulation. Desires waterbirth, low risk, has taken class #FWB: Cat 1 #GBS status:  positive #Feeding: Breastmilk  #Reproductive Life planning: LARC #Circ:  yes   Camie Rote, MSN, CNM, RNC-OB Certified Nurse Midwife, [facility] 08/11/2024 2:46 PM

## 2024-09-06 ENCOUNTER — Encounter: Payer: Self-pay | Admitting: Obstetrics and Gynecology

## 2024-09-06 ENCOUNTER — Telehealth: Admitting: Obstetrics and Gynecology

## 2024-09-06 NOTE — Progress Notes (Deleted)
. ° ° °  Post Partum Visit Note  Lauren Jensen is a 33 y.o. [phone] female who presents for a postpartum visit. She is 6 weeks postpartum following a normal spontaneous vaginal delivery.  I have fully reviewed the prenatal and intrapartum course. The delivery was at 39 gestational weeks.  Anesthesia: none. Postpartum course has been good. Baby is doing well. Baby is feeding by breast. Bleeding no bleeding. Bowel function is normal. Bladder function is normal. Patient is not sexually active. Contraception method is condoms. Postpartum depression screening: negative.   The pregnancy intention screening data noted above was reviewed. Potential methods of contraception were discussed. The patient elected to proceed with No data recorded.    Health Maintenance Due  Topic Date Due   HPV VACCINES (1 - 3-dose SCDM series) Never done   Cervical Cancer Screening (HPV/Pap Cotest)  12/05/2023   Influenza Vaccine  04/07/2024   COVID-19 Vaccine (1 - 2025-26 season) Never done    {Common ambulatory SmartLinks:19316}  Review of Systems {ros; complete:30496}  Objective:  LMP 10/19/2023    General:  {gen appearance:16600}   Breasts:  {desc; normal/abnormal/not indicated:14647}  Lungs: {lung exam:16931}  Heart:  {heart exam:5510}  Abdomen: {abdomen exam:16834}   Wound {Wound assessment:11097}  GU exam:  {desc; normal/abnormal/not indicated:14647}       Assessment:    There are no diagnoses linked to this encounter.  *** postpartum exam.   Plan:   Essential components of care per ACOG recommendations:  1.  Mood and well being: Patient with {gen negative/positive:315881} depression screening today. Reviewed local resources for support.  - Patient tobacco use? {tobacco use:25506}  - hx of drug use? {yes/no:25505}    2. Infant care and feeding:  -Patient currently breastmilk feeding? {yes/no:25502}  -Social determinants of health (SDOH) reviewed in EPIC. No concerns***The following needs were  identified***  3. Sexuality, contraception and birth spacing - Patient {DOES_DOES WNU:81435} want a pregnancy in the next year.  Desired family size is {NUMBER 1-10:22536} children.  - Reviewed reproductive life planning. Reviewed contraceptive methods based on pt preferences and effectiveness.  Patient desired {Upstream End Methods:24109} today.   - Discussed birth spacing of 18 months  4. Sleep and fatigue -Encouraged family/partner/community support of 4 hrs of uninterrupted sleep to help with mood and fatigue  5. Physical Recovery  - Discussed patients delivery and complications. She describes her labor as {description:25511} - Patient had a {CHL AMB [phone]}. Patient had a {laceration:25518} laceration. Perineal healing reviewed. Patient expressed understanding - Patient has urinary incontinence? {yes/no:25515} - Patient {ACTION; IS/IS WNU:78978602} safe to resume physical and sexual activity  6.  Health Maintenance - HM due items addressed {Yes or If no, why not?:20788} - Last pap smear No results found for: DIAGPAP Pap smear {done:10129} at today's visit.  -Breast Cancer screening indicated? {indicated:25516}  7. Chronic Disease/Pregnancy Condition follow up: {Follow up:25499}  - PCP follow up  Silvano LELON Piano, RN Center for [employer], [facility]

## 2024-09-06 NOTE — Progress Notes (Signed)
 "  Provider location: Center for [facility] [place]   Patient location: Home  I connected with patient on 09/06/2024 at 10:30 AM EST by Mychart Video Encounter and verified that I am speaking with the correct person using two identifiers.       I discussed the limitations, risks, security and privacy concerns of performing an evaluation and management service virtually and the availability of in person appointments. I also discussed with the patient that there may be a patient responsible charge related to this service. The patient expressed understanding and agreed to proceed.  Post Partum Visit Note Subjective:   Lauren Jensen is a 33 y.o. [phone] female who presents for a postpartum visit. She is 6 weeks postpartum following a normal spontaneous vaginal delivery.  I have fully reviewed the prenatal and intrapartum course. The delivery was at 39 gestational weeks.  Anesthesia: none. Postpartum course has been uncomplicated. Baby is doing well. Baby is feeding by breast. Bleeding no bleeding. Bowel function is normal. Bladder function is normal. Patient is not sexually active. Contraception method is abstinence. Postpartum depression screening: negative.   The pregnancy intention screening data noted above was reviewed. Potential methods of contraception were discussed. The patient elected to proceed with No data recorded.   Edinburgh Postnatal Depression Scale - 09/06/24 1029       Edinburgh Postnatal Depression Scale:  In the Past 7 Days   I have been able to laugh and see the funny side of things. 0    I have looked forward with enjoyment to things. 0    I have blamed myself unnecessarily when things went wrong. 1    I have been anxious or worried for no good reason. 1    I have felt scared or panicky for no good reason. 1    Things have been getting on top of me. 1    I have been so unhappy that I have had difficulty sleeping. 0    I have felt sad or miserable. 1    I  have been so unhappy that I have been crying. 1    The thought of harming myself has occurred to me. 0    Edinburgh Postnatal Depression Scale Total 6             Review of Systems Pertinent items noted in HPI and remainder of comprehensive ROS otherwise negative.  Objective:  LMP 10/19/2023   Breastfeeding Yes     General:  Alert, oriented and cooperative. Patient is in no acute distress.  Respiratory: Normal respiratory effort, no problems with respiration noted  Mental Status: Normal mood and affect. Normal behavior. Normal judgment and thought content.  Rest of physical exam deferred due to type of encounter   Assessment:    Normal postpartum exam.  Plan:  Essential components of care per ACOG recommendations:  1.  Mood and well being: Patient with negative depression screening today. Receives support from partner and grandparents who live close by - Patient does not use tobacco.  - hx of drug use? No    2. Infant care and feeding:  -Patient currently breastmilk feeding? Yes  Discussed return to work and pumping. If needed, patient was provided letter for work to allow for every 2-3 hr pumping breaks, and to be granted a private location to express breastmilk and refrigerated area to store breastmilk. Reviewed importance of draining breast regularly to support lactation. -Social determinants of health (SDOH) reviewed in EPIC. No concerns  3.  Sexuality, contraception and birth spacing - Patient does not want a pregnancy in the next year.  Desired family size is 4 children.  - Reviewed forms of contraception in tiered fashion. Patient desired condoms today.   - Discussed birth spacing of 18 months  4. Sleep and fatigue -Encouraged family/partner/community support of 4 hrs of uninterrupted sleep to help with mood and fatigue  5. Physical Recovery  - Discussed patients delivery and complications - Patient had a second degree laceration, perineal healing reviewed. Patient  expressed understanding - Patient has urinary incontinence? No  - Patient is safe to resume physical and sexual activity  6.  Health Maintenance - Last pap smear done 2022 and was normal with negative HPV. Patient informed that she is overdue for her pap smear and plans to come in the office for annual exam    I provided 15 minutes of face-to-face time during this encounter.    Return in 1 year (on 09/06/2025).  No future appointments.  Winton Felt, MD Center for [employer], [facility]  "
